# Patient Record
Sex: Female | Born: 1980 | Race: White | Hispanic: No | Marital: Single | Smoking: Never smoker
Health system: Southern US, Community
[De-identification: ages and names within clinical notes are randomized; demographics above are authoritative.]

## PROBLEM LIST (undated history)

## (undated) DIAGNOSIS — T7840XA Allergy, unspecified, initial encounter: Secondary | ICD-10-CM

## (undated) DIAGNOSIS — F419 Anxiety disorder, unspecified: Secondary | ICD-10-CM

## (undated) DIAGNOSIS — E739 Lactose intolerance, unspecified: Secondary | ICD-10-CM

## (undated) HISTORY — DX: Anxiety disorder, unspecified: F41.9

## (undated) HISTORY — DX: Lactose intolerance, unspecified: E73.9

## (undated) HISTORY — PX: LASIK: SHX215

## (undated) HISTORY — DX: Allergy, unspecified, initial encounter: T78.40XA

## (undated) HISTORY — PX: SHOULDER SURGERY: SHX246

---

## 2006-11-20 ENCOUNTER — Ambulatory Visit: Payer: Self-pay | Admitting: Internal Medicine

## 2006-11-20 LAB — CONVERTED CEMR LAB
ALT: 18 units/L (ref 0–35)
Albumin: 3.9 g/dL (ref 3.5–5.2)
BUN: 11 mg/dL (ref 6–23)
Cholesterol: 166 mg/dL (ref 0–200)
Creatinine, Ser: 0.8 mg/dL (ref 0.4–1.2)
Eosinophils Absolute: 0.2 10*3/uL (ref 0.0–0.6)
GFR calc Af Amer: 111 mL/min
GFR calc non Af Amer: 91 mL/min
HCT: 38.5 % (ref 36.0–46.0)
HDL: 60.3 mg/dL (ref >39.0)
Monocytes Relative: 7.2 % (ref 3.0–11.0)
Neutro Abs: 2.9 10*3/uL (ref 1.4–7.7)
Neutrophils Relative %: 60.3 % (ref 43.0–77.0)
RBC: 4.22 M/uL (ref 3.87–5.11)
RDW: 11.3 % — ABNORMAL LOW (ref 11.5–14.6)
Saturation Ratios: 34.4 % (ref 20.0–50.0)
Total Bilirubin: 0.7 mg/dL (ref 0.3–1.2)
Total Protein: 7.4 g/dL (ref 6.0–8.3)
Triglycerides: 125 mg/dL (ref 0–149)
VLDL: 25 mg/dL (ref 0–40)
WBC: 4.9 10*3/uL (ref 4.5–10.5)

## 2007-02-05 ENCOUNTER — Encounter: Admission: RE | Admit: 2007-02-05 | Discharge: 2007-02-05 | Payer: Self-pay | Admitting: Obstetrics and Gynecology

## 2008-01-09 ENCOUNTER — Ambulatory Visit: Payer: Self-pay | Admitting: Internal Medicine

## 2008-01-09 DIAGNOSIS — M545 Low back pain: Secondary | ICD-10-CM

## 2008-01-09 DIAGNOSIS — IMO0002 Reserved for concepts with insufficient information to code with codable children: Secondary | ICD-10-CM

## 2008-01-13 ENCOUNTER — Encounter: Admission: RE | Admit: 2008-01-13 | Discharge: 2008-01-13 | Payer: Self-pay | Admitting: Internal Medicine

## 2008-01-14 ENCOUNTER — Encounter: Payer: Self-pay | Admitting: Internal Medicine

## 2008-01-17 ENCOUNTER — Encounter: Admission: RE | Admit: 2008-01-17 | Discharge: 2008-03-20 | Payer: Self-pay | Admitting: Internal Medicine

## 2008-01-23 ENCOUNTER — Ambulatory Visit: Payer: Self-pay | Admitting: Internal Medicine

## 2008-01-29 ENCOUNTER — Encounter: Payer: Self-pay | Admitting: Internal Medicine

## 2008-01-30 ENCOUNTER — Encounter: Payer: Self-pay | Admitting: Internal Medicine

## 2008-02-06 ENCOUNTER — Encounter: Payer: Self-pay | Admitting: Internal Medicine

## 2008-03-17 ENCOUNTER — Encounter: Payer: Self-pay | Admitting: Internal Medicine

## 2008-04-17 ENCOUNTER — Encounter: Payer: Self-pay | Admitting: Internal Medicine

## 2010-01-11 ENCOUNTER — Ambulatory Visit: Payer: Self-pay | Admitting: Emergency Medicine

## 2010-01-11 DIAGNOSIS — R300 Dysuria: Secondary | ICD-10-CM | POA: Insufficient documentation

## 2010-01-11 LAB — CONVERTED CEMR LAB
Ketones, urine, test strip: NEGATIVE
Specific Gravity, Urine: 1.01
Urobilinogen, UA: 1

## 2010-01-13 ENCOUNTER — Encounter: Payer: Self-pay | Admitting: Emergency Medicine

## 2010-05-12 NOTE — Letter (Signed)
Summary: Out of School  [facility]  [address]   [place], [place] [address]   Phone: [phone]  Fax: [phone]    January 11, 2010   Student:  Ardine Eng    To Whom It May Concern:   For Medical reasons, please excuse the above named student from class and today's test for the following dates:  Start:   January 11, 2010  If you need additional information, please feel free to contact our office.   Sincerely,    Hoyt Koch MD    ****This is a legal document and cannot be tampered with.  Schools are authorized to verify all information and to do so accordingly.

## 2010-05-12 NOTE — Assessment & Plan Note (Signed)
Summary: POSS UTI/TJ   Vital Signs:  Patient Profile:   30 Years Old Female CC:      dysuria x today Height:     67 inches Weight:      127 pounds O2 Sat:      100 % O2 treatment:    Room Air Temp:     97.7 degrees F oral Pulse rate:   77 / minute Resp:     16 per minute BP sitting:   122 / 87  (left arm) Cuff size:   regular  Vitals Entered By: Lajean Saver RN (January 11, 2010 6:42 PM)                  Updated Prior Medication List: YAZ 3-0.02 MG TABS (DROSPIRENONE-ETHINYL ESTRADIOL)  FLONASE 50 MCG/ACT SUSP (FLUTICASONE PROPIONATE)  AZO-STANDARD 95 MG TABS (PHENAZOPYRIDINE HCL)   Current Allergies (reviewed today): ! NAPROSYNHistory of Present Illness Chief Complaint: dysuria x today History of Present Illness: Patient complains of UTI symptoms for 1-2 days.  She describes the pain as burning during urination.  She has not used any OTC meds. + dysuria + frequency No urgency + hematuria No vaginal discharge No fever/chills No lower abdomenal pain No back pain No fatigue   REVIEW OF SYSTEMS Constitutional Symptoms      Denies fever, chills, night sweats, weight loss, weight gain, and fatigue.  Eyes       Denies change in vision, eye pain, eye discharge, glasses, contact lenses, and eye surgery. Ear/Nose/Throat/Mouth       Denies hearing loss/aids, change in hearing, ear pain, ear discharge, dizziness, frequent runny nose, frequent nose bleeds, sinus problems, sore throat, hoarseness, and tooth pain or bleeding.  Respiratory       Denies dry cough, productive cough, wheezing, shortness of breath, asthma, bronchitis, and emphysema/COPD.  Cardiovascular       Denies murmurs, chest pain, and tires easily with exhertion.    Gastrointestinal       Denies stomach pain, nausea/vomiting, diarrhea, constipation, blood in bowel movements, and indigestion. Genitourniary       Complains of painful urination.      Denies kidney stones and loss of urinary  control. Neurological       Denies paralysis, seizures, and fainting/blackouts. Musculoskeletal       Denies muscle pain, joint pain, joint stiffness, decreased range of motion, redness, swelling, muscle weakness, and gout.  Skin       Denies bruising, unusual mles/lumps or sores, and hair/skin or nail changes.  Psych       Denies mood changes, temper/anger issues, anxiety/stress, speech problems, depression, and sleep problems.  Past History:  Past Medical History: Reviewed history from 01/09/2008 and no changes required. Unremarkable  Past Surgical History: Reviewed history from 01/09/2008 and no changes required. Denies surgical history  Family History: Reviewed history from 01/09/2008 and no changes required. Family History Diabetes 1st degree relative Family History Hypertension Family History Thyroid disease  Social History: Occupation:accountant Single Never Smoked Alcohol use-rare Drug use-no Regular exercise-yes Physical Exam General appearance: well developed, well nourished, no acute distress Heart: regular rate and  rhythm, no murmur Back: no CVAT Skin: no obvious rashes or lesions MSE: oriented to time, place, and person Assessment New Problems: URINARY TRACT INFECTION (ICD-599.0) DYSURIA (ICD-788.1)   Patient Education: Patient and/or caregiver instructed in the following: rest, fluids, Tylenol prn.  Plan New Medications/Changes: PHENAZOPYRIDINE HCL 200 MG TABS (PHENAZOPYRIDINE HCL) 1 tab by mouth three times a  day for 2 days  #6 x 0, 01/11/2010, Hoyt Koch MD CIPROFLOXACIN HCL 250 MG TABS (CIPROFLOXACIN HCL) 1 tab by mouth two times a day for 7 days  #14 x 0, 01/11/2010, Hoyt Koch MD  New Orders: T-Culture, Urine [[id number]] New Patient Level III [14782] UA Dipstick w/o Micro (automated)  [81003] Planning Comments:   Wipe front to back Hydration Take meds as directed Follow-up with your primary care physician if not  improving or if getting worse   The patient and/or caregiver has been counseled thoroughly with regard to medications prescribed including dosage, schedule, interactions, rationale for use, and possible side effects and they verbalize understanding.  Diagnoses and expected course of recovery discussed and will return if not improved as expected or if the condition worsens. Patient and/or caregiver verbalized understanding.  Prescriptions: PHENAZOPYRIDINE HCL 200 MG TABS (PHENAZOPYRIDINE HCL) 1 tab by mouth three times a day for 2 days  #6 x 0   Entered and Authorized by:   Hoyt Koch MD   Signed by:   Hoyt Koch MD on 01/11/2010   Method used:   Print then Give to Patient   RxID:   [id number] CIPROFLOXACIN HCL 250 MG TABS (CIPROFLOXACIN HCL) 1 tab by mouth two times a day for 7 days  #14 x 0   Entered and Authorized by:   Hoyt Koch MD   Signed by:   Hoyt Koch MD on 01/11/2010   Method used:   Print then Give to Patient   RxID:   [id number]   Orders Added: 1)  T-Culture, Urine [[id number]] 2)  New Patient Level III [74259] 3)  UA Dipstick w/o Micro (automated)  [81003]  Laboratory Results   Urine Tests  Date/Time Received: January 11, 2010 7:03 PM  Date/Time Reported: January 11, 2010 7:03 PM   Routine Urinalysis   Color: orange Appearance: Clear Glucose: trace   (Normal Range: Negative) Bilirubin: negative   (Normal Range: Negative) Ketone: negative   (Normal Range: Negative) Spec. Gravity: 1.010   (Normal Range: 1.003-1.035) Blood: 3+   (Normal Range: Negative) pH: 5.0   (Normal Range: 5.0-8.0) Protein: 1+   (Normal Range: Negative) Urobilinogen: 1.0   (Normal Range: 0-1) Nitrite: positive   (Normal Range: Negative) Leukocyte Esterace: 3+   (Normal Range: Negative)    Comments: Azo taken today

## 2010-08-24 NOTE — Assessment & Plan Note (Signed)
[name]                           PRIMARY CARE OFFICE NOTE   Colleen Hendrix, Colleen Hendrix                         MRN:          [id number]  DATE:11/20/2006                            DOB:          1980-04-20    CHIEF COMPLAINT:  New patient to practice.   HISTORY OF PRESENT ILLNESS:  Patient is a 30 year old white female here  to establish primary care.  She is originally from the [place]  area but last lived in [place], [place] x3 years.  She reports history of  gastroesophageal reflux disease.  In Spring of 2006, she experienced  severe abdominal pain.  She was evaluated by a gastroenterologist but  due to financial reasons did not have an EGD.  She was prescribed  Aciphex x1 month which completely relieved her symptoms.  She attributes  her ulcers to stress on the job.  She denied any history of dysphagia.   She has had bilaternal laser eye surgery.  She had redo in the right eye  due to astigmatism.   CURRENT MEDICATIONS:  Yasmin birth control 1 a day.   ALLERGIES TO MEDICATIONS:  NONE KNOWN.   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   FAMILY HISTORY:  Mother and father are both in their early 57s.  Mother  has high blood pressure and high cholesterol.  Maternal grandfather does  have CABG x3.  Maternal grandfather has leukemia.  There are several  extended family members that have hemochromatosis.  Mother is known to  be carrier.  Patient has older sister that is hypothyroid.   HABITS:  Occasional social alcohol, no history of tobacco use.   PREVENTATIVE CARE HISTORY:  Her last Pap was in September 11 of 2007.  She routinely follows up with a gynecologist.  Her last tetanus shot was  in the year 2000.   REVIEW OF SYSTEMS:  Occasional sneezing, nasal congestion.  Patient  denies any chest pain but has intermittent palpitations.  No shortness  of breath.  No  history of asthma.  No current GI symptoms.  No  constipation, diarrhea.  No dark stools or blood in her stool and all  other systems negative.  She exercises on a regular basis and follows a  very healthy diet.   PHYSICAL EXAMINATION:  VITALS:  Height is 5 foot 7, weight is 120  pounds, temperature is 98.1, pulse is 85, blood pressure is 104/73.  GENERAL:  This patient is a well-developed, well-nourished 30 year old  thin white female, no apparent distress.  HEENT:  Normocephalic/atraumatic, pupils are equal and reactive to light  bilaterally, extraocular motility was intact, patient was anicteric,  conjunctivae was within normal limits, external auditory canals and  tympanic membranes were clear bilaterally, hearing was grossly normal,  oropharyngeal exam was unremarkable.  NECK:  Supple, I could not appreciate any thyromegaly or thyroid nodules  and there was no carotid bruit.  CHEST: Normal respiratory effort, lungs are clear to auscultation  bilaterally.  No rhonchi, rales or wheezing.  CARDIOVASCULAR:  Regular rate and rhythm, no significant murmurs, rub or  gallops appreciated.  ABDOMEN:  Soft, nontender, positive bowel sounds, no organomegaly.  MUSCULOSKELETAL:  No clubbing, cyanosis or edema.  SKIN:  Warm and dry.  NEUROLOGIC:  Cranial nerves II-XII grossly intact; non-focal.   IMPRESSION:  Routine physical is otherwise healthy 30 year old white  female with past medical history of presumed gastric ulcers related to  stress and family history of hemachromatosis.   RECOMMENDATIONS:  We will obtain a baseline EKG today and screening labs  checking her thyroid, CBC.  I added iron studies to screen for  hemachromatosis although my suspicion is low.   Followup will be every 1-2 years or as needed.     Barbette Hair. Artist Pais, DO  Electronically Signed    RDY/MedQ  DD: 11/20/2006  DT: 11/20/2006  Job #: [id number]

## 2011-06-30 ENCOUNTER — Encounter (INDEPENDENT_AMBULATORY_CARE_PROVIDER_SITE_OTHER): Payer: BC Managed Care – PPO | Admitting: Obstetrics and Gynecology

## 2011-06-30 DIAGNOSIS — N760 Acute vaginitis: Secondary | ICD-10-CM

## 2011-06-30 DIAGNOSIS — N949 Unspecified condition associated with female genital organs and menstrual cycle: Secondary | ICD-10-CM

## 2011-06-30 DIAGNOSIS — N898 Other specified noninflammatory disorders of vagina: Secondary | ICD-10-CM

## 2011-08-08 ENCOUNTER — Telehealth: Payer: Self-pay | Admitting: Obstetrics and Gynecology

## 2011-08-08 NOTE — Telephone Encounter (Signed)
Routed to triage 

## 2011-08-09 ENCOUNTER — Other Ambulatory Visit: Payer: Self-pay | Admitting: Obstetrics and Gynecology

## 2011-08-09 MED ORDER — NORETHIN ACE-ETH ESTRAD-FE 1-20 MG-MCG(24) PO TABS: ORAL_TABLET | ORAL | Status: DC

## 2011-08-09 NOTE — Telephone Encounter (Signed)
Tc to pt per rx request. Pt needs rf's for Loestrin 24 due to original rx form 05/2011 no having any rf's added. Rx called to pharmacy with rf's thru 05/2012. Spoke with Erica(pharmacist). Pt agrees.

## 2011-10-09 ENCOUNTER — Ambulatory Visit (INDEPENDENT_AMBULATORY_CARE_PROVIDER_SITE_OTHER): Payer: BC Managed Care – PPO | Admitting: Internal Medicine

## 2011-10-09 VITALS — BP 121/83 | HR 64 | Temp 98.7°F | Resp 16 | Ht 67.0 in | Wt 130.0 lb

## 2011-10-09 DIAGNOSIS — Z23 Encounter for immunization: Secondary | ICD-10-CM

## 2011-10-09 DIAGNOSIS — S0990XA Unspecified injury of head, initial encounter: Secondary | ICD-10-CM

## 2011-10-09 NOTE — Progress Notes (Signed)
  Subjective:    Patient ID: Winda Summerall, female    DOB: 06/26/1980, 31 y.o.   MRN: [id number]  HPI 31 yr old [place] female presents with head injury due to hitting her head  on the corner of a bannister on the top of the stairs while bending forward picking up laundry. No loss of conscious; Headache; No weakness; No trouble walking;Some nausea;neck pain. Pt states she was dizzy while driving to [facility].    Past history of mild concussion 3 years ago with no sequelae  Review of SystemsNoncontributory     Objective:   Physical Exam Vital signs stable In no acute distress Contusion with swelling and abrasion over the left for head-small Pupils equal round reactive to light and accommodation/EOMs conjugate Cranial nerves II through XII are intact DTRs symmetrical in upper and lower extremities No sensory or motor losses of her lower or upper extrem is tender over the left para cervical and trapezius area on the left with pain on neck extension but full range of motion otherwise     Assessment & Plan:  Head injury without loss of consciousness Abrasion face Cervical strain on the left  Reassured Tylenol for headache if needed Follow up If problems increase Call if needs a muscle relaxer at bedtime, otherwise apply heat to neck

## 2012-01-19 ENCOUNTER — Other Ambulatory Visit: Payer: Self-pay | Admitting: Family Medicine

## 2012-01-19 MED ORDER — FOLIC ACID 1 MG PO TABS: 1.0000 mg | ORAL_TABLET | Freq: Every day | ORAL | Status: AC

## 2012-04-03 ENCOUNTER — Ambulatory Visit (INDEPENDENT_AMBULATORY_CARE_PROVIDER_SITE_OTHER): Payer: BC Managed Care – PPO | Admitting: Physician Assistant

## 2012-04-03 VITALS — BP 112/77 | HR 86 | Temp 98.5°F | Resp 16 | Ht 67.0 in | Wt 127.6 lb

## 2012-04-03 DIAGNOSIS — J069 Acute upper respiratory infection, unspecified: Secondary | ICD-10-CM

## 2012-04-03 DIAGNOSIS — J029 Acute pharyngitis, unspecified: Secondary | ICD-10-CM

## 2012-04-03 MED ORDER — TRAMADOL HCL 50 MG PO TABS: 50.0000 mg | ORAL_TABLET | Freq: Three times a day (TID) | ORAL | Status: DC | PRN

## 2012-04-03 MED ORDER — IPRATROPIUM BROMIDE 0.06 % NA SOLN: 2.0000 | Freq: Three times a day (TID) | NASAL | Status: DC

## 2012-04-03 NOTE — Progress Notes (Signed)
Patient ID: Colleen Hendrix MRN: [id number], DOB: [birth year], 31 y.o. Date of Encounter: 04/03/2012, 2:32 PM  Primary Physician: Sanda Linger, MD  Chief Complaint:  Chief Complaint  Patient presents with  . Sore Throat    x 4 days  . Headache  . Nasal Congestion    HPI: 32 y.o. year old female presents with a three day history of nasal congestion, rhinorrhea, sinus pressure, sinus headache, and a one day history of sore throat. Afebrile and no chills. No cough or otalgia. Normal hearing. No GI complaints. Able to swallow saliva, but hurts to do so. Decreased appetite secondary to sore throat. Has been taking Mucinex and this has bene helping considerably. She normally would not be her to be evaluated for this; however, her grandmother is ill at her parent's house and she does not want to get her sick. This does not feel like her typical strep throat infections.   No past medical history on file.   Home Meds: Prior to Admission medications   Medication Sig Start Date End Date Taking? Authorizing Provider  folic acid (FOLVITE) 1 MG tablet Take 1 tablet (1 mg total) by mouth daily. 01/19/12  Yes Heather M Marte, PA-C  Norethindrone Acetate-Ethinyl Estrad-FE (LOESTRIN 24 FE) 1-20 MG-MCG(24) tablet Pt to take 1 tab po for 12 wks of active pills then 3days off 08/09/11  Yes Hal Morales, MD    Allergies: No Known Allergies  History   Social History  . Marital Status: Single    Spouse Name: N/A    Number of Children: N/A  . Years of Education: N/A   Occupational History  . Not on file.   Social History Main Topics  . Smoking status: Never Smoker   . Smokeless tobacco: Not on file  . Alcohol Use: 0.0 oz/week     Comment: once a week  . Drug Use: Not on file  . Sexually Active: Yes    Birth Control/ Protection: Pill   Other Topics Concern  . Not on file   Social History Narrative  . No narrative on file     Review of Systems: Constitutional: Positive for fatigue.  Negative for chills, fever, night sweats or weight changes HEENT: see above Cardiovascular: negative for chest pain or palpitations Respiratory: negative for cough, hemoptysis, wheezing, or shortness of breath Abdominal: negative for abdominal pain, nausea, vomiting or diarrhea Dermatological: negative for rash Neurologic: Positive for headache.    Physical Exam: Blood pressure 112/77, pulse 86, temperature 98.5 F (36.9 C), temperature source Oral, resp. rate 16, height 5\' 7"  (1.702 m), weight 127 lb 9.6 oz (57.879 kg), SpO2 100.00%., Body mass index is 19.98 kg/(m^2). General: Well developed, well nourished, in no acute distress. Head: Normocephalic, atraumatic, eyes without discharge, sclera non-icteric, nares are congested. Bilateral auditory canals clear, TM's are without perforation, pearly grey with reflective cone of light bilaterally. Mild right sided maxillary and frontal sinus TTP. Oral cavity moist, dentition normal. Posterior pharynx erythematous with post nasal drip. No peritonsillar abscess or tonsillar exudate. Uvula midline. Neck: Supple. No thyromegaly. Full ROM. No lymphadenopathy. Mild AC lymphadenopathy < 2 cm. Lungs: Clear bilaterally to auscultation without wheezes, rales, or rhonchi. Breathing is unlabored. Heart: RRR with S1 S2. No murmurs, rubs, or gallops appreciated. Msk:  Strength and tone normal for age. Extremities: No clubbing or cyanosis. No edema. Neuro: Alert and oriented X 3. Moves all extremities spontaneously. CNII-XII grossly in tact. Psych:  Responds to questions appropriately with a normal  affect.   Labs: Results for orders placed in visit on 04/03/12  POCT RAPID STREP A (OFFICE)      Component Value Range   Rapid Strep A Screen Negative  Negative   Throat culture pending.  ASSESSMENT AND PLAN:  31 y.o. year old female with viral URI and pharyngitis.  -Supportive care -Continue Mucinex -Add in Atrovent NS 0.06% 2 sprays each nare bid prn #1  RF 3 -Ultram 50 mg 1 po tid prn pain #30 no RF, SED -Push water -Rest -Await culture results -Avoid direct face to face contact -Good hand washing practices -RTC precautions -RTC 3-5 days if no improvement  Signed, Eula Listen, PA-C 04/03/2012 2:32 PM

## 2012-04-05 NOTE — Progress Notes (Signed)
Quick Note:  Await final results.  Eula Listen, PA-C 04/05/2012 1:00 PM ______

## 2012-04-06 ENCOUNTER — Ambulatory Visit (INDEPENDENT_AMBULATORY_CARE_PROVIDER_SITE_OTHER): Payer: BC Managed Care – PPO | Admitting: Physician Assistant

## 2012-04-06 VITALS — BP 100/70 | HR 77 | Temp 98.2°F | Resp 16 | Ht 67.0 in | Wt 132.0 lb

## 2012-04-06 DIAGNOSIS — R197 Diarrhea, unspecified: Secondary | ICD-10-CM

## 2012-04-06 DIAGNOSIS — J069 Acute upper respiratory infection, unspecified: Secondary | ICD-10-CM

## 2012-04-06 DIAGNOSIS — R110 Nausea: Secondary | ICD-10-CM

## 2012-04-06 DIAGNOSIS — R1110 Vomiting, unspecified: Secondary | ICD-10-CM

## 2012-04-06 LAB — POCT CBC
Granulocyte percent: 70.9 %G (ref 37–80)
Hemoglobin: 12.9 g/dL (ref 12.2–16.2)
MCH, POC: 30.5 pg (ref 27–31.2)
MCHC: 31.9 g/dL (ref 31.8–35.4)
MCV: 95.7 fL (ref 80–97)
MID (cbc): 0.4 (ref 0–0.9)
POC Granulocyte: 5.3 (ref 2–6.9)
POC LYMPH PERCENT: 23.5 %L (ref 10–50)
RBC: 4.23 M/uL (ref 4.04–5.48)
RDW, POC: 12.5 %

## 2012-04-06 LAB — POCT URINALYSIS DIPSTICK
Blood, UA: NEGATIVE
Ketones, UA: NEGATIVE
Leukocytes, UA: NEGATIVE
Nitrite, UA: NEGATIVE
Protein, UA: NEGATIVE
Spec Grav, UA: 1.01
pH, UA: 7

## 2012-04-06 LAB — CULTURE, GROUP A STREP

## 2012-04-06 LAB — COMPREHENSIVE METABOLIC PANEL
ALT: 13 U/L (ref 0–35)
AST: 15 U/L (ref 0–37)
Chloride: 104 mEq/L (ref 96–112)
Potassium: 4.7 mEq/L (ref 3.5–5.3)
Total Bilirubin: 0.4 mg/dL (ref 0.3–1.2)
Total Protein: 7.1 g/dL (ref 6.0–8.3)

## 2012-04-06 LAB — POCT URINE PREGNANCY: Preg Test, Ur: NEGATIVE

## 2012-04-06 LAB — POCT UA - MICROSCOPIC ONLY: Yeast, UA: NEGATIVE

## 2012-04-06 MED ORDER — ONDANSETRON 4 MG PO TBDP: 8.0000 mg | ORAL_TABLET | Freq: Once | ORAL | Status: DC

## 2012-04-06 MED ORDER — ONDANSETRON 4 MG PO TBDP: 4.0000 mg | ORAL_TABLET | Freq: Three times a day (TID) | ORAL | Status: DC | PRN

## 2012-04-06 NOTE — Progress Notes (Signed)
Patient ID: Colleen Hendrix MRN: [id number], DOB: Aug 25, 1980, 31 y.o. Date of Encounter: 04/06/2012, 1:40 PM  Primary Physician: Sanda Linger, MD  Chief Complaint: Continued nasal congestion and rhinorrhea. Now with nausea and diarrhea.   HPI: 31 y.o. year old female here originally seen on 04/03/12 with three day history of nasal congestion, rhinorrhea, sinus pressure, headache, and one day history of ST. At that time she was diagnosed with viral URI and pharyngitis. Her RST and throat culture were negative. She was treated with Atrovent nasal spray and given Ultram for her sore throat. She does not feel like the Atrovent nasal spray has helped her.   Since her office visit on 04/03/12 she states her sore throat has resolved. Her nasal congestion, rhinorrhea, and cough persist. Her cough is not productive. She denies any chest congestion and feels like her cough is more so related to her post nasal drip. She is not short of breath and does not have any wheezing. On 04/04/12 in the evening she did develop some nausea without emesis and diarrhea. Today she has had three loose stools. No blood mixed in. She complains of mild abdominal pain/cramping. She denies any fevers, but has had some chills. She has been taking Tylenol consistently and wonders if this could be masking any fever.   Interestingly, she does bring up the fact that for the past one year she will have an episode once per month shortly after eating of both nausea, vomiting, and diarrhea all at the same time. She states she is usually sitting on the toilet with a trash can in her hands. She has always had problems with lactose and for the most part has cut this out of her diet, but sometimes she does consume products with this. She has always chalked this up to "food poisoning or being stressed." She does state that she has a very stressful job. 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 Her boyfriend will sometimes eat the exact same meal at a restaurant and be asymptomatic. These episodes are not associated with any discrete right upper quadrant pain, just generalized abdominal discomfort. After the above emesis and diarrhea she does feel better outside of being a little tired/fatigued. She is wondering if this could also be related to her gallbladder. She denies any dysuria, urinary frequency, urinary urgency, or vaginal symptoms.   She has had two evaluations by two separate gynecologists for likely endometriosis. She does not want to have an exploratory laparoscopy at this time. Currently she is treating this with continuous birth control and has noticed a considerable improvement in her symptoms.         No past medical history on file.   Home Meds: Prior to Admission medications   Medication Sig Start Date End Date Taking? Authorizing Provider  folic acid (FOLVITE) 1 MG tablet Take 1 tablet (1 mg total) by mouth daily. 01/19/12  Yes Heather M Marte, PA-C  ipratropium (ATROVENT) 0.06 % nasal spray Place 2 sprays into the nose 3 (three) times daily. 04/03/12  Yes Tekeisha Hakim M Nicholl Onstott, PA-C  Norethindrone Acetate-Ethinyl Estrad-FE (LOESTRIN 24 FE) 1-20 MG-MCG(24) tablet Pt to take 1 tab po for 12 wks of active pills then 3days off 08/09/11  Yes Hal Morales, MD  traMADol (ULTRAM) 50 MG tablet Take 1 tablet (50 mg total) by mouth 3 (three) times daily as needed for pain. 04/03/12  Yes Cataleyah Colborn  M Samel Bruna, PA-C    Allergies: No Known Allergies  History   Social History  . Marital Status: Single    Spouse Name: N/A    Number of Children: N/A  . Years of Education: N/A   Occupational History  . Not on file.   Social History Main Topics  . Smoking status: Never Smoker   . Smokeless tobacco: Not on file  . Alcohol Use: 0.0 oz/week     Comment: once a week  . Drug Use: Not on file  . Sexually Active: Yes    Birth Control/ Protection: Pill   Other  Topics Concern  . Not on file   Social History Narrative  . No narrative on file     Review of Systems: Constitutional: Positive for chills and fatigue. Negative for fever, night sweats, or weight changes  HEENT: see above Cardiovascular: negative for chest pain or palpitations Respiratory: Positive for cough. Negative for hemoptysis, wheezing, or shortness of breath Abdominal: Positive for abdominal pain, nausea, and diarrhea. Negative for vomiting Dermatological: negative for rash Neurologic: negative for dizziness, or syncope   Physical Exam: Blood pressure 100/70, pulse 77, temperature 98.2 F (36.8 C), temperature source Oral, resp. rate 16, height 5\' 7"  (1.702 m), weight 132 lb (59.875 kg), SpO2 97.00%., Body mass index is 20.67 kg/(m^2). General: Well developed, well nourished, in no acute distress. Head: Normocephalic, atraumatic, eyes without discharge, sclera non-icteric, nares are congested. Bilateral auditory canals clear, TM's are without perforation, pearly grey and translucent with reflective cone of light bilaterally. Oral cavity moist, posterior pharynx with post nasal drip. No exudate, erythema, or peritonsillar abscess. Uvula midline. Neck: Supple. No thyromegaly. Full ROM. Isolated lymph node right AC less than 2 cm. Lungs: Clear bilaterally to auscultation without wheezes, rales, or rhonchi. Breathing is unlabored. Heart: RRR with S1 S2. No murmurs, rubs, or gallops appreciated. Abdomen: Soft, non-distended with normoactive bowel sounds. Mild diffuse tenderness to palpation. No hepatosplenomegaly. Negative Murphy's. No rebound/guarding. No obvious abdominal masses. Negative McBurney's and table jar.  Msk:  Strength and tone normal for age. Extremities/Skin: Warm and dry. No clubbing or cyanosis. No edema. No rashes or suspicious lesions. Neuro: Alert and oriented X 3. Moves all extremities spontaneously. Gait is normal. CNII-XII grossly in tact. Psych:  Responds to  questions appropriately with a normal affect.   Labs: Results for orders placed in visit on 04/06/12  POCT CBC      Component Value Range   WBC 7.5  4.6 - 10.2 K/uL   Lymph, poc 1.8  0.6 - 3.4   POC LYMPH PERCENT 23.5  10 - 50 %L   MID (cbc) 0.4  0 - 0.9   POC MID % 5.6  0 - 12 %M   POC Granulocyte 5.3  2 - 6.9   Granulocyte percent 70.9  37 - 80 %G   RBC 4.23  4.04 - 5.48 M/uL   Hemoglobin 12.9  12.2 - 16.2 g/dL   HCT, POC 29.5  62.1 - 47.9 %   MCV 95.7  80 - 97 fL   MCH, POC 30.5  27 - 31.2 pg   MCHC 31.9  31.8 - 35.4 g/dL   RDW, POC 30.8     Platelet Count, POC 205  142 - 424 K/uL   MPV 10.2  0 - 99.8 fL  POCT INFLUENZA A/B      Component Value Range   Influenza A, POC Negative     Influenza B, POC Negative  POCT URINE PREGNANCY      Component Value Range   Preg Test, Ur Negative    POCT UA - MICROSCOPIC ONLY      Component Value Range   WBC, Ur, HPF, POC 1-2     RBC, urine, microscopic 0-1     Bacteria, U Microscopic trace     Mucus, UA neg     Epithelial cells, urine per micros 0-2     Crystals, Ur, HPF, POC neg     Casts, Ur, LPF, POC neg     Yeast, UA neg    POCT URINALYSIS DIPSTICK      Component Value Range   Color, UA pale yellow     Clarity, UA clear     Glucose, UA neg     Bilirubin, UA neg     Ketones, UA neg     Spec Grav, UA 1.010     Blood, UA neg     pH, UA 7.0     Protein, UA neg     Urobilinogen, UA 0.2     Nitrite, UA neg     Leukocytes, UA Negative      CMP pending  ASSESSMENT AND PLAN:  31 y.o. year old female with viral URI, nausea, diarrhea, and history of longstanding nausea, vomiting, and diarrhea.  1. Viral URI -Continue Atrovent nasal -Continue Mucinex -Rest -Fluids -Traveling to the beach in 48 hours, breathing in the air there should help her  2. Nausea/diarrhea -Zofran 4 mg ODT x 2 given in office -Possibly adverse effect from her Ultram -She stopped her Ultram the previous day, it has yet to be 24 hours since she  stopped this -Supportive care at this time -Zofran 4 mg ODT 1 po q 8 hours prn nausea #12 RF 2 -Push fluids -Rest -Return to clinic precautions  3. History of longstanding nausea, vomiting, and diarrhea -Await CMP -Schedule abdominal ultrasound -She will be out of town the entire up coming week, will plan for this to be completed when she is back  -If normal ultrasound, will plan to treat from stress standpoint first. If she fails that trial will refer to GI. -Has Zofran for prn use -Food diary to check for any associations  -Return to clinic precautions   Signed, Eula Listen, PA-C 04/06/2012 1:40 PM

## 2012-04-07 ENCOUNTER — Other Ambulatory Visit: Payer: Self-pay | Admitting: Physician Assistant

## 2012-04-07 DIAGNOSIS — R7989 Other specified abnormal findings of blood chemistry: Secondary | ICD-10-CM

## 2012-04-07 DIAGNOSIS — R197 Diarrhea, unspecified: Secondary | ICD-10-CM

## 2012-04-07 NOTE — Addendum Note (Signed)
Addended by: Sondra Barges on: 04/07/2012 09:54 AM   Modules accepted: Orders

## 2012-04-17 ENCOUNTER — Encounter: Payer: Self-pay | Admitting: *Deleted

## 2012-05-25 ENCOUNTER — Other Ambulatory Visit: Payer: Self-pay

## 2013-02-03 ENCOUNTER — Encounter: Payer: Self-pay | Admitting: Internal Medicine

## 2013-02-03 ENCOUNTER — Ambulatory Visit (INDEPENDENT_AMBULATORY_CARE_PROVIDER_SITE_OTHER): Payer: BC Managed Care – PPO | Admitting: Internal Medicine

## 2013-02-03 VITALS — BP 124/80 | HR 74 | Temp 97.9°F | Resp 16 | Ht 67.5 in | Wt 133.0 lb

## 2013-02-03 DIAGNOSIS — H8309 Labyrinthitis, unspecified ear: Secondary | ICD-10-CM

## 2013-02-03 DIAGNOSIS — S61409A Unspecified open wound of unspecified hand, initial encounter: Secondary | ICD-10-CM

## 2013-02-03 DIAGNOSIS — S61402A Unspecified open wound of left hand, initial encounter: Secondary | ICD-10-CM

## 2013-02-03 DIAGNOSIS — R51 Headache: Secondary | ICD-10-CM

## 2013-02-03 MED ORDER — MECLIZINE HCL 25 MG PO TABS: 25.0000 mg | ORAL_TABLET | Freq: Three times a day (TID) | ORAL | Status: DC | PRN

## 2013-02-03 NOTE — Progress Notes (Signed)
Subjective:    Patient ID: Colleen Hendrix, female    DOB: [birth year], 32 y.o.   MRN: [id number] HPI  HPI Comments: Colleen Hendrix is a 32 y.o. female who presents to the [facility] complaining of a left hand laceration. Pt also complains of what she thinks is "motion sickness". Pt states she was cutting tomatoes today and mistakenly cut her finger. Bleeding is controlled. She reports she flew to [facility] last Saturday and a migraine began the next day. Pt states she usually gets "car sick". She reports feeling "not fully with it'. Pt states she is feeling a bit nauseas, having some tenderness, has a "bit" of a sore throat, and experiencing some constant pressure in the head. She has taken ibuprofen w/o relief for the h/a. Pt denies any other pain. She denies any insomnia. She denies any head trauma. She reports being able to eat normally but does not have a huge appetite. She reports "taking a walk" to relieve symptoms but there was no relief. Sitting down for a long period of time makes her feel better. She reports usually having allergies this time of year. Pt takes zyrtec and Claritin to relive allergies.         Review of Systems  Constitutional: Negative for fever and chills.  HENT: Positive for sinus pressure and sore throat. Negative for congestion.   Skin: Positive for wound.  Allergic/Immunologic: Positive for environmental allergies.  Neurological: Positive for headaches.        Objective:   Physical Exam  Nursing note and vitals reviewed. Constitutional: She is oriented to person, place, and time. She appears well-developed and well-nourished. No distress.  Awake, alert, nontoxic appearance  HENT:  Head: Normocephalic and atraumatic.  Right Ear: External ear normal.  Left Ear: External ear normal.  Nose: Nose normal.  Mouth/Throat: Oropharynx is clear and moist. No oropharyngeal exudate.  Mild boggy turbs  Eyes: Conjunctivae and EOM are  normal. Pupils are equal, round, and reactive to light. No scleral icterus.  Neck: Normal range of motion. Neck supple. No thyromegaly present.  Cardiovascular: Normal rate, regular rhythm, normal heart sounds and intact distal pulses.   No murmur heard. Pulmonary/Chest: Effort normal and breath sounds normal. No respiratory distress. She has no wheezes.  Abdominal: Soft. Bowel sounds are normal. She exhibits no mass. There is no tenderness. There is no rebound and no guarding.  Musculoskeletal: Normal range of motion. She exhibits no edema.  Lymphadenopathy:    She has no cervical adenopathy.  Neurological: She is alert and oriented to person, place, and time. She has normal reflexes. No cranial nerve deficit. Coordination normal.  Speech is clear and goal oriented Moves extremities symm/gait wnl No nystag with dix-Hallpike even tho sxt precip by looking L and rolling head to L Cerebell intact  Skin: Skin is warm and dry. She is not diaphoretic.  Psychiatric: She has a normal mood and affect. Her behavior is normal. Thought content normal.  puncture wound L hand web space L 2/3 //minor w/out signs tendon or nerve invol     Assessment & Plan:  HA (headache)--R frontal/?related to sinus congestion  Acute labyrinthitis, unspecified laterality  Wound, open, hand with or without fingers, left, initial encounter  Meds ordered this encounter  Medications  . Loratadine (CLARITIN PO)    Sig: Take by mouth.  . Cetirizine HCl (ZYRTEC ALLERGY PO)    Sig: Take by mouth.  . meclizine (ANTIVERT) 25 MG tablet  Sig: Take 1 tablet (25 mg total) by mouth 3 (three) times daily as needed for dizziness or nausea. Especially hs    Dispense:  20 tablet    Refill:  0   Add sudafed Use ibupr in am F/u 1 week if not well

## 2013-08-28 ENCOUNTER — Ambulatory Visit (INDEPENDENT_AMBULATORY_CARE_PROVIDER_SITE_OTHER): Payer: BC Managed Care – PPO | Admitting: Family Medicine

## 2013-08-28 VITALS — BP 112/68 | HR 72 | Temp 98.0°F | Resp 16 | Ht 66.0 in | Wt 134.4 lb

## 2013-08-28 DIAGNOSIS — R110 Nausea: Secondary | ICD-10-CM

## 2013-08-28 DIAGNOSIS — R197 Diarrhea, unspecified: Secondary | ICD-10-CM

## 2013-08-28 LAB — POCT CBC
Granulocyte percent: 54 %G (ref 37–80)
HCT, POC: 40.5 % (ref 37.7–47.9)
Hemoglobin: 13.2 g/dL (ref 12.2–16.2)
Lymph, poc: 1.6 (ref 0.6–3.4)
MCH, POC: 30.6 pg (ref 27–31.2)
MCHC: 32.6 g/dL (ref 31.8–35.4)
MCV: 93.7 fL (ref 80–97)
MID (cbc): 0.3 (ref 0–0.9)
MPV: 11.2 fL (ref 0–99.8)
POC Granulocyte: 2.3 (ref 2–6.9)
POC LYMPH PERCENT: 37.8 %L (ref 10–50)
POC MID %: 8.2 %M (ref 0–12)
Platelet Count, POC: 188 10*3/uL (ref 142–424)
RBC: 4.32 M/uL (ref 4.04–5.48)
RDW, POC: 12.9 %
WBC: 4.2 10*3/uL — AB (ref 4.6–10.2)

## 2013-08-28 LAB — POCT UA - MICROSCOPIC ONLY
Bacteria, U Microscopic: NEGATIVE
Casts, Ur, LPF, POC: NEGATIVE
Crystals, Ur, HPF, POC: NEGATIVE
Mucus, UA: NEGATIVE
RBC, urine, microscopic: NEGATIVE
Yeast, UA: NEGATIVE

## 2013-08-28 LAB — POCT URINALYSIS DIPSTICK
Bilirubin, UA: NEGATIVE
Blood, UA: NEGATIVE
Glucose, UA: NEGATIVE
Ketones, UA: NEGATIVE
Leukocytes, UA: NEGATIVE
Nitrite, UA: NEGATIVE
Protein, UA: NEGATIVE
Spec Grav, UA: 1.01
Urobilinogen, UA: 0.2
pH, UA: 7.5

## 2013-08-28 NOTE — Progress Notes (Signed)
Subjective:    Patient ID: Colleen Hendrix, female    DOB: 1980/06/22, 33 y.o.   MRN: [id number]  Diarrhea  Associated symptoms include abdominal pain. Pertinent negatives include no chills, fever or vomiting.   This chart was scribed for Elvina SidleKurt Lauenstein, MD by Andrew Auaven Small, ED Scribe. This patient was seen in room 4 and the patient's care was started at 12:28 PM.  HPI Comments: Colleen Hendrix is a 33 y.o. female who presents to the [facility] complaining of diarrhea onset one week. Pt denies feeling bad. She reports an hour or two after eating she has watery stool. Pt feels relief after BM. Pt reports last episode was this morning. Pt reports mid-lower abdominal cramping and abdominal bloating. Pt denies blood in stool.  Pt has not taken an OTC medication. Pt reports stress at work. Pt lives with boyfriend but denies sick contacts. Pt is lactose intolerant but has eliminated dairy from diet. Pt denies fever or any other symptoms. Pt denies taking abx. Pt denies any other medical illness.   Patient is very reluctant to take any medication whatsoever. He wants to avoid antibiotics.  No past medical history on file. No Known Allergies Prior to Admission medications   Medication Sig Start Date End Date Taking? Authorizing Provider  Cetirizine HCl (ZYRTEC ALLERGY PO) Take by mouth.   Yes Historical Provider, MD  folic acid (FOLVITE) 1 MG tablet Take 1 tablet (1 mg total) by mouth daily. 01/19/12  Yes Heather M Marte, PA-C  Loratadine (CLARITIN PO) Take by mouth.   Yes Historical Provider, MD  Norethindrone Acetate-Ethinyl Estrad-FE (LOESTRIN 24 FE) 1-20 MG-MCG(24) tablet Pt to take 1 tab po for 12 wks of active pills then 3days off 08/09/11  Yes Hal MoralesVanessa P Haygood, MD  ipratropium (ATROVENT) 0.06 % nasal spray Place 2 sprays into the nose 3 (three) times daily. 04/03/12   Sondra Bargesyan M Dunn, PA-C  meclizine (ANTIVERT) 25 MG tablet Take 1 tablet (25 mg total) by mouth 3 (three) times daily  as needed for dizziness or nausea. Especially hs 02/03/13   Tonye Pearsonobert P Doolittle, MD   Review of Systems  Constitutional: Negative for fever, chills and diaphoresis.  Gastrointestinal: Positive for nausea, abdominal pain, diarrhea and abdominal distention. Negative for vomiting and blood in stool.      Objective:   Physical Exam  Nursing note and vitals reviewed. Constitutional: She is oriented to person, place, and time. She appears well-developed and well-nourished. No distress.  HENT:  Head: Normocephalic and atraumatic.  Eyes: Conjunctivae and EOM are normal. Pupils are equal, round, and reactive to light. No scleral icterus.  Neck: Neck supple. No tracheal deviation present. No thyromegaly present.  Cardiovascular: Normal rate.   Pulmonary/Chest: Effort normal. No respiratory distress.  Abdominal: Soft. She exhibits distension. She exhibits no mass. There is tenderness. There is no rebound and no guarding.  Hyperactive bowel sounds, mild distention, mild lower abdominal tenderness  Musculoskeletal: Normal range of motion.  Neurological: She is alert and oriented to person, place, and time.  Skin: Skin is warm and dry.  Psychiatric: She has a normal mood and affect. Her behavior is normal.   Results for orders placed in visit on 08/28/13  POCT CBC      Result Value Ref Range   WBC 4.2 (*) 4.6 - 10.2 K/uL   Lymph, poc 1.6  0.6 - 3.4   POC LYMPH PERCENT 37.8  10 - 50 %L   MID (cbc) 0.3  0 - 0.9   POC MID % 8.2  0 - 12 %M   POC Granulocyte 2.3  2 - 6.9   Granulocyte percent 54.0  37 - 80 %G   RBC 4.32  4.04 - 5.48 M/uL   Hemoglobin 13.2  12.2 - 16.2 g/dL   HCT, POC 40.940.5  81.137.7 - 47.9 %   MCV 93.7  80 - 97 fL   MCH, POC 30.6  27 - 31.2 pg   MCHC 32.6  31.8 - 35.4 g/dL   RDW, POC 91.412.9     Platelet Count, POC 188  142 - 424 K/uL   MPV 11.2  0 - 99.8 fL  POCT URINALYSIS DIPSTICK      Result Value Ref Range   Color, UA yellow     Clarity, UA clear     Glucose, UA neg      Bilirubin, UA neg     Ketones, UA neg     Spec Grav, UA 1.010     Blood, UA neg     pH, UA 7.5     Protein, UA neg     Urobilinogen, UA 0.2     Nitrite, UA neg     Leukocytes, UA Negative    POCT UA - MICROSCOPIC ONLY      Result Value Ref Range   WBC, Ur, HPF, POC 0-1     RBC, urine, microscopic neg     Bacteria, U Microscopic neg     Mucus, UA neg     Epithelial cells, urine per micros 0-3     Crystals, Ur, HPF, POC neg     Casts, Ur, LPF, POC neg     Yeast, UA neg          Assessment & Plan:     Nausea alone - Plan: POCT CBC, POCT urinalysis dipstick, POCT UA - Microscopic Only, COMPLETE METABOLIC PANEL WITH GFR, T4, free  Diarrhea - Plan: POCT CBC, POCT urinalysis dipstick, POCT UA - Microscopic Only, COMPLETE METABOLIC PANEL WITH GFR, T4, free  Most likely this is a viral gastroenteritis that his not responded to the usual measures. I explained patient that getting on probiotics and taking Imodium may be all she needs. At the same time I think we need to double check other possible explanations at this point since it's been one week of symptoms.  Signed, Elvina SidleKurt Lauenstein, MD

## 2013-08-28 NOTE — Patient Instructions (Signed)
Diarrhea Diarrhea is frequent loose and watery bowel movements. It can cause you to feel weak and dehydrated. Dehydration can cause you to become tired and thirsty, have a dry mouth, and have decreased urination that often is dark yellow. Diarrhea is a sign of another problem, most often an infection that will not last long. In most cases, diarrhea typically lasts 2 3 days. However, it can last longer if it is a sign of something more serious. It is important to treat your diarrhea as directed by your caregive to lessen or prevent future episodes of diarrhea. CAUSES  Some common causes include:  Gastrointestinal infections caused by viruses, bacteria, or parasites.  Food poisoning or food allergies.  Certain medicines, such as antibiotics, chemotherapy, and laxatives.  Artificial sweeteners and fructose.  Digestive disorders. HOME CARE INSTRUCTIONS  Ensure adequate fluid intake (hydration): have 1 cup (8 oz) of fluid for each diarrhea episode. Avoid fluids that contain simple sugars or sports drinks, fruit juices, whole milk products, and sodas. Your urine should be clear or pale yellow if you are drinking enough fluids. Hydrate with an oral rehydration solution that you can purchase at pharmacies, retail stores, and online. You can prepare an oral rehydration solution at home by mixing the following ingredients together:    tsp table salt.   tsp baking soda.   tsp salt substitute containing potassium chloride.  1  tablespoons sugar.  1 L (34 oz) of water.  Certain foods and beverages may increase the speed at which food moves through the gastrointestinal (GI) tract. These foods and beverages should be avoided and include:  Caffeinated and alcoholic beverages.  High-fiber foods, such as raw fruits and vegetables, nuts, seeds, and whole grain breads and cereals.  Foods and beverages sweetened with sugar alcohols, such as xylitol, sorbitol, and mannitol.  Some foods may be well  tolerated and may help thicken stool including:  Starchy foods, such as rice, toast, pasta, low-sugar cereal, oatmeal, grits, baked potatoes, crackers, and bagels.  Bananas.  Applesauce.  Add probiotic-rich foods to help increase healthy bacteria in the GI tract, such as yogurt and fermented milk products.  Wash your hands well after each diarrhea episode.  Only take over-the-counter or prescription medicines as directed by your caregiver.  Take a warm bath to relieve any burning or pain from frequent diarrhea episodes. SEEK IMMEDIATE MEDICAL CARE IF:   You are unable to keep fluids down.  You have persistent vomiting.  You have blood in your stool, or your stools are black and tarry.  You do not urinate in 6 8 hours, or there is only a small amount of very dark urine.  You have abdominal pain that increases or localizes.  You have weakness, dizziness, confusion, or lightheadedness.  You have a severe headache.  Your diarrhea gets worse or does not get better.  You have a fever or persistent symptoms for more than 2 3 days.  You have a fever and your symptoms suddenly get worse. MAKE SURE YOU:   Understand these instructions.  Will watch your condition.  Will get help right away if you are not doing well or get worse. Document Released: 03/18/2002 Document Revised: 03/14/2012 Document Reviewed: 12/04/2011 [employer] Patient Information 2014 [employer].  

## 2013-08-29 LAB — COMPLETE METABOLIC PANEL WITH GFR
ALT: 18 U/L (ref 0–35)
AST: 19 U/L (ref 0–37)
Albumin: 4.3 g/dL (ref 3.5–5.2)
Alkaline Phosphatase: 40 U/L (ref 39–117)
BUN: 9 mg/dL (ref 6–23)
CO2: 24 mEq/L (ref 19–32)
Calcium: 9.3 mg/dL (ref 8.4–10.5)
Chloride: 104 mEq/L (ref 96–112)
Creat: 0.8 mg/dL (ref 0.50–1.10)
GFR, Est African American: >89 mL/min
GFR, Est Non African American: >89 mL/min
Glucose, Bld: 78 mg/dL (ref 70–99)
Potassium: 4.1 mEq/L (ref 3.5–5.3)
Sodium: 137 mEq/L (ref 135–145)
Total Bilirubin: 0.5 mg/dL (ref 0.2–1.2)
Total Protein: 7.4 g/dL (ref 6.0–8.3)

## 2013-08-29 LAB — T4, FREE: Free T4: 1.18 ng/dL (ref 0.80–1.80)

## 2013-09-05 ENCOUNTER — Ambulatory Visit (INDEPENDENT_AMBULATORY_CARE_PROVIDER_SITE_OTHER): Payer: BC Managed Care – PPO | Admitting: Physician Assistant

## 2013-09-05 ENCOUNTER — Other Ambulatory Visit: Payer: Self-pay | Admitting: Physician Assistant

## 2013-09-05 VITALS — BP 116/68 | HR 74 | Temp 98.0°F | Resp 16 | Ht 66.0 in | Wt 136.6 lb

## 2013-09-05 DIAGNOSIS — R197 Diarrhea, unspecified: Secondary | ICD-10-CM

## 2013-09-05 LAB — POCT CBC
GRANULOCYTE PERCENT: 52.1 % (ref 37–80)
HEMATOCRIT: 38.6 % (ref 37.7–47.9)
HEMOGLOBIN: 12.7 g/dL (ref 12.2–16.2)
Lymph, poc: 2 (ref 0.6–3.4)
MCH: 30.8 pg (ref 27–31.2)
MCHC: 32.9 g/dL (ref 31.8–35.4)
MCV: 93.8 fL (ref 80–97)
MID (cbc): 0.8 (ref 0–0.9)
MPV: 11.6 fL (ref 0–99.8)
POC Granulocyte: 3 (ref 2–6.9)
POC LYMPH PERCENT: 34.2 %L (ref 10–50)
POC MID %: 13.7 % — AB (ref 0–12)
Platelet Count, POC: 165 10*3/uL (ref 142–424)
RBC: 4.12 M/uL (ref 4.04–5.48)
RDW, POC: 12.6 %
WBC: 5.8 10*3/uL (ref 4.6–10.2)

## 2013-09-05 NOTE — Progress Notes (Signed)
   Subjective:    Patient ID: Colleen Hendrix, female    DOB: 02/18/1981, 33 y.o.   MRN: [id number]  HPI Pt presents to clinic with continued diarrhea - she has had diarrhea for 16 days and it does not seem to be changing.  She has 3-4 watery stools a day - they occur after adb cramping.  She has noticed no blood in the stool.  She states that the stool is slight frothy - she states that is does have a foul odor.  She has been around no one with similar symptoms.  She works in an office with about 5 other people.  She has had no recent travel.  She has no recent abx.  She has had no nausea or vomiting.  She has h/o lactose intolerance but does not eat mil products.  She has been using Imodium and that helps slow down the diarrhea.  After she has taken it for a couple of days her stool with start to solidify but then as soon as she stops it the watery diarrhea returns.  She has been mainly drinking water, any time she eats something she knows about 4 hours later she will have cramping with diarrhea.  She has had no fever.  She has not been under a lot more stress than normal.  Review of Systems  Constitutional: Negative for fever and chills.  Gastrointestinal: Positive for abdominal pain (cramping) and diarrhea. Negative for nausea, vomiting and blood in stool.       Objective:   Physical Exam  Vitals reviewed. Constitutional: She is oriented to person, place, and time. She appears well-developed and well-nourished.  HENT:  Head: Normocephalic and atraumatic.  Right Ear: External ear normal.  Left Ear: External ear normal.  Eyes: Conjunctivae are normal.  Cardiovascular: Normal rate, regular rhythm and normal heart sounds.   No murmur heard. Pulmonary/Chest: Effort normal and breath sounds normal. She has no wheezes.  Neurological: She is alert and oriented to person, place, and time.  Skin: Skin is warm and dry.  Psychiatric: She has a normal mood and affect. Her behavior is normal. Judgment  and thought content normal.        Assessment & Plan:  Diarrhea - Plan: POCT CBC, COMPLETE METABOLIC PANEL WITH GFR, Stool culture, Clostridium difficile EIA, Ova and parasite examination, Ova and parasite examination, Ova and parasite examination  Due to the length of time she has had diarrhea we will do stool cultures.  If they are neg she will need a referral to [facility].  Benny Lennert PA-C  [facility] 09/05/2013 10:27 PM

## 2013-09-05 NOTE — Patient Instructions (Signed)
Continue to push fluids like you are currently doing. Please collect the stool samples and once we get the results we will make the next decision.

## 2013-09-06 LAB — COMPLETE METABOLIC PANEL WITH GFR
ALBUMIN: 3.8 g/dL (ref 3.5–5.2)
ALT: 18 U/L (ref 0–35)
AST: 19 U/L (ref 0–37)
Alkaline Phosphatase: 42 U/L (ref 39–117)
BUN: 9 mg/dL (ref 6–23)
CHLORIDE: 103 meq/L (ref 96–112)
CO2: 26 meq/L (ref 19–32)
CREATININE: 0.81 mg/dL (ref 0.50–1.10)
Calcium: 8.6 mg/dL (ref 8.4–10.5)
GFR, Est Non African American: >89 mL/min
GLUCOSE: 76 mg/dL (ref 70–99)
POTASSIUM: 4.1 meq/L (ref 3.5–5.3)
Sodium: 138 mEq/L (ref 135–145)
TOTAL PROTEIN: 6.8 g/dL (ref 6.0–8.3)
Total Bilirubin: 0.3 mg/dL (ref 0.2–1.2)

## 2013-09-07 LAB — C. DIFFICILE GDH AND TOXIN A/B
C. DIFF TOXIN A/B: DETECTED — AB
C. DIFFICILE GDH: NOT DETECTED

## 2013-09-07 LAB — CLOSTRIDIUM DIFFICILE BY PCR: CDIFFPCR: NOT DETECTED

## 2013-09-09 LAB — OVA AND PARASITE EXAMINATION
OP: NONE SEEN
OP: NONE SEEN

## 2013-09-10 LAB — OVA AND PARASITE EXAMINATION: OP: NONE SEEN

## 2013-09-10 LAB — STOOL CULTURE

## 2013-09-13 ENCOUNTER — Telehealth: Payer: Self-pay

## 2013-09-13 NOTE — Telephone Encounter (Signed)
Patient is wondering if her sxs are from her gall bladder.  She is only having trouble at night.  Do you have a specialist for her to see or does she need to pick someone?

## 2013-09-16 ENCOUNTER — Other Ambulatory Visit: Payer: Self-pay | Admitting: Physician Assistant

## 2013-09-16 DIAGNOSIS — R197 Diarrhea, unspecified: Secondary | ICD-10-CM

## 2013-09-16 NOTE — Telephone Encounter (Signed)
I did a referral for Gi to [facility].  I would be doubtful about the Gallbladder because she is not having pain but it is possible.

## 2013-09-17 NOTE — Telephone Encounter (Signed)
Advised pt

## 2013-09-24 ENCOUNTER — Encounter: Payer: Self-pay | Admitting: Internal Medicine

## 2013-09-30 ENCOUNTER — Ambulatory Visit (INDEPENDENT_AMBULATORY_CARE_PROVIDER_SITE_OTHER): Payer: BC Managed Care – PPO | Admitting: Internal Medicine

## 2013-09-30 ENCOUNTER — Encounter: Payer: Self-pay | Admitting: Internal Medicine

## 2013-09-30 VITALS — BP 102/74 | HR 80 | Ht 66.5 in | Wt 133.4 lb

## 2013-09-30 DIAGNOSIS — R112 Nausea with vomiting, unspecified: Secondary | ICD-10-CM

## 2013-09-30 DIAGNOSIS — R197 Diarrhea, unspecified: Secondary | ICD-10-CM

## 2013-09-30 MED ORDER — DICYCLOMINE HCL 20 MG PO TABS: 20.0000 mg | ORAL_TABLET | Freq: Three times a day (TID) | ORAL | Status: DC

## 2013-09-30 NOTE — Progress Notes (Signed)
Subjective:    Patient ID: Colleen Hendrix, female    DOB: 1980-09-16, 33 y.o.   MRN: [id number]  HPI The patient is a very pleasant 33 year old woman who was well until middle of May when she developed a diarrheal illness associated with some nausea and vomiting. She had eaten the same thing coworkers and her husband and he never 2 days and none of them got sick. She had profuse watery diarrhea, after week she presented to urgent medical and family care, diagnosis of most likely viral gastroenteritis made. After persistent problems stool studies for culture, C. difficile toxins, ova and parasites were performed and the C. difficile toxin study was positive but PCR subsequently performed was negative. The diarrhea is somewhat better but in general she continues with watery stools that are urgent both during the day and at night. She has had some normal bowel movements. There is no fever or bleeding or abdomen is sore. She has also had some intermittent nausea and vomiting. This is been immediately after eating. There is no history of chronic diarrhea. She also had a thyroid test checked and was okay as well as a CBC. He does have a diagnosis of probable lactose intolerance. This is different than that. She is otherwise been very healthy. She did not have any foreign travel though she went to [place] one point. There have been no recent antibiotics.  Allergies  Allergen Reactions  . Lactose Intolerance (Gi)    Outpatient Prescriptions Prior to Visit  Medication Sig Dispense Refill  . Cetirizine HCl (ZYRTEC ALLERGY PO) Take by mouth.      . folic acid (FOLVITE) 1 MG tablet Take 1 tablet (1 mg total) by mouth daily.  30 tablet  0  . Loratadine (CLARITIN PO) Take by mouth.      . Norethindrone Acetate-Ethinyl Estrad-FE (LOESTRIN 24 FE) 1-20 MG-MCG(24) tablet Pt to take 1 tab po for 12 wks of active pills then 3days off  3 Package  3  . ipratropium (ATROVENT) 0.06 % nasal spray Place 2 sprays  into the nose 3 (three) times daily.  15 mL  3  . meclizine (ANTIVERT) 25 MG tablet Take 1 tablet (25 mg total) by mouth 3 (three) times daily as needed for dizziness or nausea. Especially hs  20 tablet  0   No facility-administered medications prior to visit.   Past Medical History  Diagnosis Date  . Lactose intolerance    Past Surgical History  Procedure Laterality Date  . Lasik Bilateral   . Shoulder surgery Right    History   Social History  . Marital Status: Single    Spouse Name: N/A    Number of Children: 0  . 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 Smoking status: Never Smoker   . Smokeless tobacco: Never Used  . Alcohol Use: 0.0 oz/week     Comment: once a week-wine  . Drug Use: No  . Sexual Activity: Yes    Birth Control/ Protection: Pill    Social History Narrative  . Single, without children. She is a Warehouse managervice president of an Scientist, [employer] company. One alcoholic beverage a day 1-2 caffeinated beverages daily.   Family History  Problem Relation Age of Onset  . Breast cancer Mother   . Hypertension Mother   . Heart disease Maternal Grandfather   . Hypertension Maternal Grandfather   .  Lung cancer Paternal Grandfather   . Stroke Paternal Grandfather   . Diabetes Maternal Grandmother   . Melanoma Paternal Grandfather    Review of Systems Positive for those things mentioned in the history of present illness as well as seasonal allergies. All other review of systems are negative.    Objective:   Physical Exam General:  Well-developed, well-nourished and in no acute distress Eyes:  anicteric. ENT:   Mouth and posterior pharynx free of lesions.  Neck:   supple w/o thyromegaly or mass.  Lungs: Clear to auscultation bilaterally. Heart:  S1S2, no rubs, murmurs, gallops. Abdomen:  soft, non-tender, no hepatosplenomegaly, hernia, or mass and BS+.  Rectal: deferred Lymph:  no cervical or supraclavicular  adenopathy. Extremities:   no edema Skin   no rash. Neuro:  A&O x 3.  Psych:  appropriate mood and  Affect.   Data Reviewed:  As per history of present illness and reviewed urgent medical and family notes and labs in the medical record.      Assessment & Plan:   1. Diarrhea   2. Nausea and vomiting, vomiting of unspecified type    1. Chest chronic diarrhea but does not appear to be infectious in origin. It could be a post infectious irritable bowel syndrome but given the chronicity colonoscopy with terminal ileum inspection and possible random biopsies is appropriate to look for more but chronic inflammatory bowel disease-type process or other causes. 2. Trial of dicyclomine 20 mg in the interim. 3. The risks and benefits as well as alternatives of endoscopic procedure(s) have been discussed and reviewed. All questions answered. The patient agrees to proceed.

## 2013-09-30 NOTE — Patient Instructions (Addendum)
You have been scheduled for a colonoscopy. Please follow written instructions given to you at your visit today.  Please pick up your prep supplies at the pharmacy. If you use inhalers (even only as needed), please bring them with you on the day of your procedure. Your physician has requested that you go to www.startemmi.com and enter the access code given to you at your visit today. This web site gives a general overview about your procedure. However, you should still follow specific instructions given to you by our office regarding your preparation for the procedure.   We have sent the following medications to your pharmacy for you to pick up at your convenience: Dicyclomine   I appreciate the opportunity to care for you.

## 2013-10-01 ENCOUNTER — Telehealth: Payer: Self-pay

## 2013-10-01 NOTE — Telephone Encounter (Signed)
Patient unable to move colonoscopy appointment up. When  I reached her she was at the store, currently having plumbing issues at her house and didn't feel like she could change the appointment.

## 2013-10-01 NOTE — Telephone Encounter (Signed)
ok 

## 2013-10-01 NOTE — Telephone Encounter (Signed)
Message copied by SwazilandJORDAN, PATTI E on Tue Oct 01, 2013  5:08 PM ------      Message from: Iva BoopGESSNER, CARL E      Created: Tue Oct 01, 2013  2:28 PM      Regarding: opening Thursday this week       Let her know we now have 2PM slot Thursday open (in 2 days) and if she wants it can have that spot for colonoscopy and cancel 7/2 ------

## 2013-10-04 ENCOUNTER — Encounter: Payer: Self-pay | Admitting: Internal Medicine

## 2013-10-08 ENCOUNTER — Telehealth: Payer: Self-pay

## 2013-10-08 NOTE — Telephone Encounter (Signed)
Message copied by [place]JORDAN, Nyleah Mcginnis E on Tue Oct 08, 2013  9:18 AM ------      Message from: Iva BoopGESSNER, CARL E      Created: Tue Oct 08, 2013  3:54 AM      Regarding: change olonoscopy time       Due to cancellations 7/2 she can change her time from 430 PM - please do this and adjust prep timing ------

## 2013-10-08 NOTE — Telephone Encounter (Signed)
Moved patients colonoscopy appointment from 4:30pm to 2:30pm which said will work better for her ride.  Discussed changing the prep times and she verbalized understanding.

## 2013-10-10 ENCOUNTER — Encounter: Payer: Self-pay | Admitting: Internal Medicine

## 2013-10-10 ENCOUNTER — Ambulatory Visit (AMBULATORY_SURGERY_CENTER): Payer: BC Managed Care – PPO | Admitting: Internal Medicine

## 2013-10-10 ENCOUNTER — Encounter: Payer: BC Managed Care – PPO | Admitting: Internal Medicine

## 2013-10-10 VITALS — BP 112/77 | HR 63 | Temp 98.7°F | Resp 20 | Ht 66.5 in | Wt 133.0 lb

## 2013-10-10 DIAGNOSIS — R197 Diarrhea, unspecified: Secondary | ICD-10-CM

## 2013-10-10 MED ORDER — SODIUM CHLORIDE 0.9 % IV SOLN: 500.0000 mL | INTRAVENOUS | Status: DC

## 2013-10-10 NOTE — Op Note (Addendum)
[facility] [address] [place], [id number]   COLONOSCOPY PROCEDURE REPORT  PATIENT: [name], Colleen  MR#: [id number] BIRTHDATE: 1980-07-19 , 33  yrs. old GENDER: Female ENDOSCOPIST: Iva Booparl E Gessner, MD, [facility] REFERRED BY: PROCEDURE DATE:  10/10/2013 PROCEDURE:   Colonoscopy with biopsy First Screening Colonoscopy - Avg.  risk and is 50 yrs.  old or older - No.  Prior Negative Screening - Now for repeat screening. N/A  History of Adenoma - Now for follow-up colonoscopy & has been > or = to 3 yrs.  N/A  Polyps Removed Today? No.  Recommend repeat exam, <10 yrs? No. ASA CLASS:   Class I INDICATIONS:unexplained diarrhea. MEDICATIONS: Propofol (Diprivan) 260 mg IV, MAC sedation, administered by CRNA, and These medications were titrated to patient response per physician's verbal order  DESCRIPTION OF PROCEDURE:   After the risks benefits and alternatives of the procedure were thoroughly explained, informed consent was obtained.  A digital rectal exam revealed no abnormalities of the rectum.   The LB [id number]  endoscope was introduced through the anus and advanced to the terminal ileum which was intubated for a short distance. No adverse events experienced.   The quality of the prep was excellent, using MiraLax The instrument was then slowly withdrawn as the colon was fully examined.      COLON FINDINGS: The mucosa appeared normal in the terminal ileum. A normal appearing cecum, ileocecal valve, and appendiceal orifice were identified.  The ascending, hepatic flexure, transverse, splenic flexure, descending, sigmoid colon and rectum appeared unremarkable.  No polyps or cancers were seen.  Multiple random biopsies of the area were performed.  Retroflexed views revealed no abnormalities. The time to cecum=2 minutes 31 seconds.  Withdrawal time=7 minutes 58 seconds.  The scope was withdrawn and the procedure completed. COMPLICATIONS: There were no  complications.  ENDOSCOPIC IMPRESSION: 1.   Normal mucosa in the terminal ileum 2.   Normal colon; multiple random biopsies of the area were performed  RECOMMENDATIONS: Office will call with the results.   eSigned:  Iva Booparl E Gessner, MD, [facility] 10/10/2013 2:47 PM   cc: The Patient  and Benny LennertSarah Weber, PA-C

## 2013-10-10 NOTE — Progress Notes (Signed)
Called to room to assist during endoscopic procedure.  Patient ID and intended procedure confirmed with present staff. Received instructions for my participation in the procedure from the performing physician.  

## 2013-10-10 NOTE — Progress Notes (Signed)
Procedure ends, to recovery, report given and VSS. 

## 2013-10-10 NOTE — Patient Instructions (Addendum)
The colonoscopy looked ok. I took biopsies to see if you have microscopic inflammation or cloitis. We will call with results and plans.  I appreciate the opportunity to care for you. Iva Booparl E. Sabrina Arriaga, MD, FACG   YOU HAD AN ENDOSCOPIC PROCEDURE TODAY AT THE [facility]: Refer to the procedure report that was given to you for any specific questions about what was found during the examination.  If the procedure report does not answer your questions, please call your gastroenterologist to clarify.  If you requested that your care partner not be given the details of your procedure findings, then the procedure report has been included in a sealed envelope for you to review at your convenience later.  YOU SHOULD EXPECT: Some feelings of bloating in the abdomen. Passage of more gas than usual.  Walking can help get rid of the air that was put into your GI tract during the procedure and reduce the bloating. If you had a lower endoscopy (such as a colonoscopy or flexible sigmoidoscopy) you may notice spotting of blood in your stool or on the toilet paper. If you underwent a bowel prep for your procedure, then you may not have a normal bowel movement for a few days.  DIET: Your first meal following the procedure should be a light meal and then it is ok to progress to your normal diet.  A half-sandwich or bowl of soup is an example of a good first meal.  Heavy or fried foods are harder to digest and may make you feel nauseous or bloated.  Likewise meals heavy in dairy and vegetables can cause extra gas to form and this can also increase the bloating.  Drink plenty of fluids but you should avoid alcoholic beverages for 24 hours.  ACTIVITY: Your care partner should take you home directly after the procedure.  You should plan to take it easy, moving slowly for the rest of the day.  You can resume normal activity the day after the procedure however you should NOT DRIVE or use heavy machinery for 24 hours  (because of the sedation medicines used during the test).    SYMPTOMS TO REPORT IMMEDIATELY: A gastroenterologist can be reached at any hour.  During normal business hours, 8:30 AM to 5:00 PM Monday through Friday, call [phone].  After hours and on weekends, please call the GI answering service at [phone] who will take a message and have the physician on call contact you.   Following lower endoscopy (colonoscopy or flexible sigmoidoscopy):  Excessive amounts of blood in the stool  Significant tenderness or worsening of abdominal pains  Swelling of the abdomen that is new, acute  Fever of 100F or higher    FOLLOW UP: If any biopsies were taken you will be contacted by phone or by letter within the next 1-3 weeks.  Call your gastroenterologist if you have not heard about the biopsies in 3 weeks.  Our staff will call the home number listed on your records the next business day following your procedure to check on you and address any questions or concerns that you may have at that time regarding the information given to you following your procedure. This is a courtesy call and so if there is no answer at the home number and we have not heard from you through the emergency physician on call, we will assume that you have returned to your regular daily activities without incident.  SIGNATURES/CONFIDENTIALITY: You and/or your care partner have signed  paperwork which will be entered into your electronic medical record.  These signatures attest to the fact that that the information above on your After Visit Summary has been reviewed and is understood.  Full responsibility of the confidentiality of this discharge information lies with you and/or your care-partner.

## 2013-10-14 ENCOUNTER — Telehealth: Payer: Self-pay

## 2013-10-14 NOTE — Telephone Encounter (Signed)
Left message on answering machine. 

## 2013-10-18 NOTE — Progress Notes (Signed)
Quick Note:  Normal colon biopsies - no colitis Dx is IBS Notify by My Chart No letter needed Recall colonoscopy 2025 ______

## 2013-10-24 ENCOUNTER — Encounter: Payer: Self-pay | Admitting: Internal Medicine

## 2014-10-08 ENCOUNTER — Other Ambulatory Visit: Payer: Self-pay | Admitting: Obstetrics and Gynecology

## 2014-10-10 LAB — CYTOLOGY - PAP

## 2014-10-14 ENCOUNTER — Ambulatory Visit (INDEPENDENT_AMBULATORY_CARE_PROVIDER_SITE_OTHER): Payer: BLUE CROSS/BLUE SHIELD | Admitting: Family Medicine

## 2014-10-14 VITALS — BP 124/84 | HR 81 | Temp 98.4°F | Resp 18 | Ht 67.0 in | Wt 133.0 lb

## 2014-10-14 DIAGNOSIS — J029 Acute pharyngitis, unspecified: Secondary | ICD-10-CM

## 2014-10-14 DIAGNOSIS — N898 Other specified noninflammatory disorders of vagina: Secondary | ICD-10-CM | POA: Diagnosis not present

## 2014-10-14 DIAGNOSIS — R300 Dysuria: Secondary | ICD-10-CM | POA: Diagnosis not present

## 2014-10-14 LAB — POCT URINALYSIS DIPSTICK
Bilirubin, UA: NEGATIVE
Glucose, UA: NEGATIVE
Ketones, UA: NEGATIVE
Nitrite, UA: NEGATIVE
Protein, UA: NEGATIVE
Spec Grav, UA: <=1.005
UROBILINOGEN UA: 0.2
pH, UA: 5.5

## 2014-10-14 LAB — POCT UA - MICROSCOPIC ONLY
Casts, Ur, LPF, POC: NEGATIVE
Crystals, Ur, HPF, POC: NEGATIVE
Mucus, UA: NEGATIVE
YEAST UA: NEGATIVE

## 2014-10-14 LAB — POCT RAPID STREP A (OFFICE): Rapid Strep A Screen: NEGATIVE

## 2014-10-14 LAB — POCT WET PREP WITH KOH
CLUE CELLS WET PREP PER HPF POC: NEGATIVE
KOH Prep POC: NEGATIVE
Trichomonas, UA: NEGATIVE
Yeast Wet Prep HPF POC: NEGATIVE

## 2014-10-14 NOTE — Progress Notes (Addendum)
Subjective:   This chart was scribed for Nilda SimmerKristi Analisa Sledd, MD by Jarvis Morganaylor Ferguson, ED Scribe. This patient was seen in Room 5 and the patient's care was started at 7:29 PM.   Patient ID: Colleen Hendrix, female    DOB: [birth year], 34 y.o.   MRN: [id number]  10/14/2014  Urinary Tract Infection and Fatigue   HPI  HPI Comments: Colleen Hendrix is a 34 y.o. female who presents to the [facility] complaining of constant, moderate, lower abdominal pain for 2 days. She reports associated dysuria, lower back pain, burning around opening of the vagina, "foggy headedness" and urinary frequency. She reports she got up 3x in the middle of the night last night and usually only gets up maybe 1x to urinate. She notes that she has a mild sore throat but states she has seasonal allergies so that may be the cause. Pt states she took an at home UTI test that she got from the drug store and it showed she had a high WBC. She admits she has been taking Estrace for vaginal atrophy. She denies any worry for STIs and states she has had the same female partner for 4 years. She denies any fever, chills, urinary urgency, vaginal odor, nausea, vomiting, hematuria, cough or constipation.  No tick bites but did pull a tick off of her leg a few weeks ago that was crawling on her.  Review of Systems  Constitutional: Negative for fever, chills, diaphoresis and fatigue.  HENT: Positive for sore throat. Negative for congestion, ear pain, postnasal drip, rhinorrhea, sneezing, trouble swallowing and voice change.   Respiratory: Negative for cough and shortness of breath.   Gastrointestinal: Positive for abdominal pain (lower). Negative for nausea, vomiting and constipation.  Genitourinary: Positive for dysuria, frequency, vaginal pain and pelvic pain. Negative for urgency, hematuria, flank pain, vaginal bleeding, vaginal discharge and difficulty urinating.  Musculoskeletal: Positive for back pain (lower).  Skin: Negative for  rash.    Past Medical History  Diagnosis Date  . Lactose intolerance   . Allergy    Past Surgical History  Procedure Laterality Date  . Lasik Bilateral   . Shoulder surgery Right    Allergies  Allergen Reactions  . Lactose Intolerance (Gi)    Current Outpatient Prescriptions  Medication Sig Dispense Refill  . Cetirizine HCl (ZYRTEC ALLERGY PO) Take by mouth.    . estradiol (ESTRACE) 0.1 MG/GM vaginal cream Place 1 Applicatorful vaginally at bedtime.    . folic acid (FOLVITE) 1 MG tablet Take 1 tablet (1 mg total) by mouth daily. 30 tablet 0  . Lactobacillus Rhamnosus, GG, (CULTURELLE PO) Take 1 capsule by mouth daily.    . norethindrone-ethinyl estradiol-iron (MICROGESTIN FE,GILDESS FE,LOESTRIN FE) 1.5-30 MG-MCG tablet Take 1 tablet by mouth daily.    Marland Kitchen. dicyclomine (BENTYL) 20 MG tablet Take 1 tablet (20 mg total) by mouth 4 (four) times daily -  before meals and at bedtime. (Patient not taking: Reported on 10/14/2014) 120 tablet 0  . Loratadine (CLARITIN PO) Take by mouth.     No current facility-administered medications for this visit.   History   Social History  . Marital Status: Single    Spouse Name: N/A  . Number of Children: 0  . 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 Smoking status: Never Smoker   . Smokeless tobacco: Never Used  . Alcohol Use: 0.0 oz/week  0 Standard drinks or equivalent per week     Comment: once a week-wine  . Drug Use: No  . Sexual Activity: Yes    Birth Control/ Protection: Pill   Other Topics Concern  . Not on file   Social History Narrative   Single, without children.   She is a Warehouse manager of an Scientist, forensic. One alcoholic beverage a day 1-2 caffeinated beverages daily.       Objective:    Triage Vitals: BP 124/84 mmHg  Pulse 81  Temp(Src) 98.4 F (36.9 C) (Oral)  Resp 18  Ht 5\' 7"  (1.702 m)  Wt 133 lb (60.328 kg)  BMI 20.83 kg/m2  SpO2  98%  Physical Exam  Constitutional: She is oriented to person, place, and time. She appears well-developed and well-nourished. No distress.  HENT:  Head: Normocephalic and atraumatic.  Right Ear: Tympanic membrane, external ear and ear canal normal.  Left Ear: Tympanic membrane, external ear and ear canal normal.  Nose: Nose normal.  Mouth/Throat: Mucous membranes are normal. Posterior oropharyngeal erythema (no cobblestone ) present. No oropharyngeal exudate, posterior oropharyngeal edema or tonsillar abscesses.  Eyes: Conjunctivae and EOM are normal. Pupils are equal, round, and reactive to light.  Neck: Normal range of motion. Neck supple. Carotid bruit is not present. No tracheal deviation present. No thyromegaly present.  Cardiovascular: Normal rate, regular rhythm, normal heart sounds and intact distal pulses.  Exam reveals no gallop and no friction rub.   No murmur heard. Pulmonary/Chest: Effort normal and breath sounds normal. No respiratory distress. She has no wheezes. She has no rales.  Abdominal: Soft. Bowel sounds are normal. She exhibits no distension and no mass. There is tenderness (RLQ and periumbilical region). There is no rebound and no guarding.  Genitourinary: Uterus normal. There is no rash, tenderness, lesion or injury on the right labia. There is no rash, tenderness, lesion or injury on the left labia. Cervix exhibits motion tenderness (mild). Cervix exhibits no discharge and no friability. Right adnexum displays no mass, no tenderness and no fullness. Left adnexum displays tenderness (mild). Left adnexum displays no mass and no fullness. There is erythema (mild to labia and vaginal area) in the vagina. No bleeding in the vagina. No foreign body around the vagina. Vaginal discharge (moderate, thin and white) found.  Mild cervical motion tenderness; moderate amount of thin discharge in vaginal vault.  Erythema of labia minora and vaginal wall.  Musculoskeletal: Normal range  of motion.  Lymphadenopathy:    She has no cervical adenopathy.  Neurological: She is alert and oriented to person, place, and time. No cranial nerve deficit.  Skin: Skin is warm and dry. No rash noted. She is not diaphoretic. No erythema. No pallor.  Psychiatric: She has a normal mood and affect. Her behavior is normal.  Nursing note and vitals reviewed.  Results for orders placed or performed in visit on 10/14/14  POCT UA - Microscopic Only  Result Value Ref Range   WBC, Ur, HPF, POC 5-8    RBC, urine, microscopic 0-1    Bacteria, U Microscopic 3+    Mucus, UA Negative    Epithelial cells, urine per micros 1-5    Crystals, Ur, HPF, POC Negative    Casts, Ur, LPF, POC Negative    Yeast, UA Negative   Urinalysis Dipstick  Result Value Ref Range   Color, UA straw    Clarity, UA clear    Glucose, UA negative    Bilirubin, UA negative  Ketones, UA negative    Spec Grav, UA <=1.005    Blood, UA trace-lysed    pH, UA 5.5    Protein, UA negative    Urobilinogen, UA 0.2    Nitrite, UA negative    Leukocytes, UA Trace (A) Negative  POCT rapid strep A  Result Value Ref Range   Rapid Strep A Screen Negative Negative  POCT Wet Prep with KOH  Result Value Ref Range   Trichomonas, UA Negative    Clue Cells Wet Prep HPF POC Negative    Epithelial Wet Prep HPF POC Many Few, Moderate, Many   Yeast Wet Prep HPF POC Negative    Bacteria Wet Prep HPF POC Many (A) Few   RBC Wet Prep HPF POC 0-2    WBC Wet Prep HPF POC 20+    KOH Prep POC Negative       Assessment & Plan:   1. Burning with urination   2. Vaginal irritation   3. Sore throat     1. Dysuria: New. Send urine culture; pt prefers to await culture results prior to treating. 2.  Vaginal irritation: New. Send GC/Chlam; cervicitis present; pt desires to await cx results prior to treating. If GC/Chlam negative, would treat empirically with Doxy or Ofloxacin. 3. Sore throat: New. Send throat culture; recommend supportive  care with rest, fluids, Ibuprofen. RTC inability to swallow. 4. Atrophic vaginitis: chronic; recent increase in dose of estrogen vaginal suppository; current symptoms do not appear secondary to increased estrogen dose.   Meds ordered this encounter  Medications  . estradiol (ESTRACE) 0.1 MG/GM vaginal cream    Sig: Place 1 Applicatorful vaginally at bedtime.  . norethindrone-ethinyl estradiol-iron (MICROGESTIN FE,GILDESS FE,LOESTRIN FE) 1.5-30 MG-MCG tablet    Sig: Take 1 tablet by mouth daily.    No Follow-up on file.  I personally performed the services described in this documentation, which was scribed in my presence. The recorded information has been reviewed and considered.  Eiko Mcgowen Paulita Fujita, M.D. [facility] [address] [place], [place]  [address] [phone] phone [phone] fax

## 2014-10-16 ENCOUNTER — Encounter: Payer: Self-pay | Admitting: Family Medicine

## 2014-10-16 LAB — GC/CHLAMYDIA PROBE AMP
CT PROBE, AMP APTIMA: NEGATIVE
GC Probe RNA: NEGATIVE

## 2014-10-16 LAB — CULTURE, GROUP A STREP: Organism ID, Bacteria: NORMAL

## 2014-10-16 LAB — URINE CULTURE
Colony Count: NO GROWTH
Organism ID, Bacteria: NO GROWTH

## 2014-10-16 MED ORDER — AZITHROMYCIN 250 MG PO TABS: ORAL_TABLET | ORAL | Status: DC

## 2014-10-16 NOTE — Addendum Note (Signed)
Addended by: Ethelda ChickSMITH, Roberto Hlavaty M on: 10/16/2014 05:32 PM   Modules accepted: Orders

## 2014-10-18 ENCOUNTER — Telehealth: Payer: Self-pay | Admitting: Radiology

## 2014-10-18 MED ORDER — FLUCONAZOLE 150 MG PO TABS: 150.0000 mg | ORAL_TABLET | Freq: Once | ORAL | Status: DC

## 2014-10-18 NOTE — Telephone Encounter (Signed)
Please have patient take one dose and repeat 72 hours later if needed for antibiotic associated yeast infection. Thank you!

## 2014-10-31 ENCOUNTER — Other Ambulatory Visit: Payer: Self-pay | Admitting: Urgent Care

## 2014-10-31 NOTE — Telephone Encounter (Signed)
Dr Katrinka Blazing, do you want to RF this? Pt was given #2 tablets after her OV w/you and Abxs earlier in the month.

## 2014-11-01 NOTE — Telephone Encounter (Signed)
Diflucan was sent in on 10-18-2014 without OV due to recent abx therapy; thus, not willing to refill Diflucan again. Pt presented with complicated presentation with negative work up early in July; thus, recommend return OV or evaluation by gynecology.  Please advise pt of such.

## 2016-11-29 ENCOUNTER — Other Ambulatory Visit: Payer: Self-pay | Admitting: Obstetrics and Gynecology

## 2016-11-29 DIAGNOSIS — N6320 Unspecified lump in the left breast, unspecified quadrant: Secondary | ICD-10-CM

## 2016-12-02 ENCOUNTER — Ambulatory Visit
Admission: RE | Admit: 2016-12-02 | Discharge: 2016-12-02 | Disposition: A | Discharge status: Home / Self Care | Payer: 59 | Source: Ambulatory Visit | Attending: Obstetrics and Gynecology | Admitting: Obstetrics and Gynecology

## 2016-12-02 DIAGNOSIS — N6320 Unspecified lump in the left breast, unspecified quadrant: Secondary | ICD-10-CM

## 2019-07-17 ENCOUNTER — Other Ambulatory Visit: Payer: Self-pay | Admitting: Obstetrics and Gynecology

## 2019-07-17 DIAGNOSIS — Z1231 Encounter for screening mammogram for malignant neoplasm of breast: Secondary | ICD-10-CM

## 2019-08-30 ENCOUNTER — Ambulatory Visit
Admission: RE | Admit: 2019-08-30 | Discharge: 2019-08-30 | Disposition: A | Discharge status: Home / Self Care | Payer: 59 | Source: Ambulatory Visit | Attending: Obstetrics and Gynecology | Admitting: Obstetrics and Gynecology

## 2019-08-30 ENCOUNTER — Other Ambulatory Visit: Payer: Self-pay

## 2019-08-30 DIAGNOSIS — Z1231 Encounter for screening mammogram for malignant neoplasm of breast: Secondary | ICD-10-CM

## 2019-12-26 DIAGNOSIS — T1502XD Foreign body in cornea, left eye, subsequent encounter: Secondary | ICD-10-CM | POA: Diagnosis not present

## 2019-12-26 DIAGNOSIS — H04123 Dry eye syndrome of bilateral lacrimal glands: Secondary | ICD-10-CM | POA: Diagnosis not present

## 2020-01-03 DIAGNOSIS — Z20822 Contact with and (suspected) exposure to covid-19: Secondary | ICD-10-CM | POA: Diagnosis not present

## 2020-06-29 DIAGNOSIS — L814 Other melanin hyperpigmentation: Secondary | ICD-10-CM | POA: Diagnosis not present

## 2020-06-29 DIAGNOSIS — D225 Melanocytic nevi of trunk: Secondary | ICD-10-CM | POA: Diagnosis not present

## 2020-06-29 DIAGNOSIS — L700 Acne vulgaris: Secondary | ICD-10-CM | POA: Diagnosis not present

## 2020-06-29 DIAGNOSIS — L905 Scar conditions and fibrosis of skin: Secondary | ICD-10-CM | POA: Diagnosis not present

## 2020-07-20 ENCOUNTER — Other Ambulatory Visit: Payer: Self-pay | Admitting: Obstetrics and Gynecology

## 2020-07-20 DIAGNOSIS — Z1231 Encounter for screening mammogram for malignant neoplasm of breast: Secondary | ICD-10-CM

## 2020-08-07 DIAGNOSIS — M7672 Peroneal tendinitis, left leg: Secondary | ICD-10-CM | POA: Diagnosis not present

## 2020-08-07 DIAGNOSIS — M216X2 Other acquired deformities of left foot: Secondary | ICD-10-CM | POA: Diagnosis not present

## 2020-08-12 DIAGNOSIS — M25572 Pain in left ankle and joints of left foot: Secondary | ICD-10-CM | POA: Diagnosis not present

## 2020-08-17 DIAGNOSIS — M25572 Pain in left ankle and joints of left foot: Secondary | ICD-10-CM | POA: Diagnosis not present

## 2020-08-20 DIAGNOSIS — M25572 Pain in left ankle and joints of left foot: Secondary | ICD-10-CM | POA: Diagnosis not present

## 2020-08-24 DIAGNOSIS — M25572 Pain in left ankle and joints of left foot: Secondary | ICD-10-CM | POA: Diagnosis not present

## 2020-08-28 DIAGNOSIS — M25571 Pain in right ankle and joints of right foot: Secondary | ICD-10-CM | POA: Diagnosis not present

## 2020-09-01 DIAGNOSIS — M25572 Pain in left ankle and joints of left foot: Secondary | ICD-10-CM | POA: Diagnosis not present

## 2020-09-04 ENCOUNTER — Ambulatory Visit
Admission: RE | Admit: 2020-09-04 | Discharge: 2020-09-04 | Disposition: A | Discharge status: Home / Self Care | Payer: BC Managed Care – PPO | Source: Ambulatory Visit

## 2020-09-04 ENCOUNTER — Other Ambulatory Visit: Payer: Self-pay

## 2020-09-04 DIAGNOSIS — Z1231 Encounter for screening mammogram for malignant neoplasm of breast: Secondary | ICD-10-CM

## 2020-09-08 DIAGNOSIS — M25572 Pain in left ankle and joints of left foot: Secondary | ICD-10-CM | POA: Diagnosis not present

## 2020-09-14 DIAGNOSIS — M25572 Pain in left ankle and joints of left foot: Secondary | ICD-10-CM | POA: Diagnosis not present

## 2020-12-17 DIAGNOSIS — Z01419 Encounter for gynecological examination (general) (routine) without abnormal findings: Secondary | ICD-10-CM | POA: Diagnosis not present

## 2020-12-17 DIAGNOSIS — Z681 Body mass index (BMI) 19 or less, adult: Secondary | ICD-10-CM | POA: Diagnosis not present

## 2020-12-25 DIAGNOSIS — L718 Other rosacea: Secondary | ICD-10-CM | POA: Diagnosis not present

## 2021-01-05 DIAGNOSIS — L309 Dermatitis, unspecified: Secondary | ICD-10-CM | POA: Diagnosis not present

## 2021-01-05 DIAGNOSIS — L508 Other urticaria: Secondary | ICD-10-CM | POA: Diagnosis not present

## 2021-01-12 DIAGNOSIS — L501 Idiopathic urticaria: Secondary | ICD-10-CM | POA: Diagnosis not present

## 2021-01-12 DIAGNOSIS — J309 Allergic rhinitis, unspecified: Secondary | ICD-10-CM | POA: Diagnosis not present

## 2021-01-12 DIAGNOSIS — T783XXD Angioneurotic edema, subsequent encounter: Secondary | ICD-10-CM | POA: Diagnosis not present

## 2021-01-28 DIAGNOSIS — H0102A Squamous blepharitis right eye, upper and lower eyelids: Secondary | ICD-10-CM | POA: Diagnosis not present

## 2021-01-28 DIAGNOSIS — H0102B Squamous blepharitis left eye, upper and lower eyelids: Secondary | ICD-10-CM | POA: Diagnosis not present

## 2021-01-28 DIAGNOSIS — H04123 Dry eye syndrome of bilateral lacrimal glands: Secondary | ICD-10-CM | POA: Diagnosis not present

## 2021-01-28 DIAGNOSIS — H1045 Other chronic allergic conjunctivitis: Secondary | ICD-10-CM | POA: Diagnosis not present

## 2021-03-13 DIAGNOSIS — L509 Urticaria, unspecified: Secondary | ICD-10-CM | POA: Diagnosis not present

## 2021-03-23 DIAGNOSIS — J302 Other seasonal allergic rhinitis: Secondary | ICD-10-CM | POA: Diagnosis not present

## 2021-03-23 DIAGNOSIS — L719 Rosacea, unspecified: Secondary | ICD-10-CM | POA: Diagnosis not present

## 2021-03-23 DIAGNOSIS — E538 Deficiency of other specified B group vitamins: Secondary | ICD-10-CM | POA: Diagnosis not present

## 2021-03-23 DIAGNOSIS — Z1329 Encounter for screening for other suspected endocrine disorder: Secondary | ICD-10-CM | POA: Diagnosis not present

## 2021-03-23 DIAGNOSIS — E559 Vitamin D deficiency, unspecified: Secondary | ICD-10-CM | POA: Diagnosis not present

## 2021-03-23 DIAGNOSIS — Z131 Encounter for screening for diabetes mellitus: Secondary | ICD-10-CM | POA: Diagnosis not present

## 2021-03-23 DIAGNOSIS — L299 Pruritus, unspecified: Secondary | ICD-10-CM | POA: Diagnosis not present

## 2021-03-25 DIAGNOSIS — L508 Other urticaria: Secondary | ICD-10-CM | POA: Diagnosis not present

## 2021-03-25 DIAGNOSIS — L700 Acne vulgaris: Secondary | ICD-10-CM | POA: Diagnosis not present

## 2021-03-25 DIAGNOSIS — L501 Idiopathic urticaria: Secondary | ICD-10-CM | POA: Diagnosis not present

## 2021-03-25 DIAGNOSIS — L718 Other rosacea: Secondary | ICD-10-CM | POA: Diagnosis not present

## 2021-05-04 DIAGNOSIS — L299 Pruritus, unspecified: Secondary | ICD-10-CM | POA: Diagnosis not present

## 2021-05-04 DIAGNOSIS — L719 Rosacea, unspecified: Secondary | ICD-10-CM | POA: Diagnosis not present

## 2021-05-04 DIAGNOSIS — E538 Deficiency of other specified B group vitamins: Secondary | ICD-10-CM | POA: Diagnosis not present

## 2021-05-04 DIAGNOSIS — J302 Other seasonal allergic rhinitis: Secondary | ICD-10-CM | POA: Diagnosis not present

## 2021-05-17 DIAGNOSIS — Z309 Encounter for contraceptive management, unspecified: Secondary | ICD-10-CM | POA: Diagnosis not present

## 2021-06-02 ENCOUNTER — Other Ambulatory Visit: Payer: Self-pay

## 2021-06-02 ENCOUNTER — Encounter: Payer: Self-pay | Admitting: Physician Assistant

## 2021-06-02 ENCOUNTER — Ambulatory Visit (INDEPENDENT_AMBULATORY_CARE_PROVIDER_SITE_OTHER): Payer: BC Managed Care – PPO | Admitting: Physician Assistant

## 2021-06-02 VITALS — BP 127/87 | HR 87 | Temp 98.0°F | Ht 67.0 in | Wt 118.0 lb

## 2021-06-02 DIAGNOSIS — L509 Urticaria, unspecified: Secondary | ICD-10-CM

## 2021-06-02 DIAGNOSIS — H9313 Tinnitus, bilateral: Secondary | ICD-10-CM

## 2021-06-02 DIAGNOSIS — Z872 Personal history of diseases of the skin and subcutaneous tissue: Secondary | ICD-10-CM | POA: Diagnosis not present

## 2021-06-02 DIAGNOSIS — R030 Elevated blood-pressure reading, without diagnosis of hypertension: Secondary | ICD-10-CM

## 2021-06-02 NOTE — Progress Notes (Signed)
Subjective:    Patient ID: Colleen Hendrix, female    DOB: 10/11/1980, 41 y.o.   MRN: [id number]  Chief Complaint  Patient presents with   Establish Care   Hypertension    HPI 41 y.o. patient presents today for new patient establishment with me.  Patient has not had PCP in a few years.  Current Care Team: Dr. Zelphia Cairo with [facility] Allergists and dermatologist - hives of unknown origin [facility] - hives, acne, rosacea   Acute Concerns: 138/95 a few weeks ago Morning BP is normal or low, I.e. 90/77; usually diastolic goes up in afternoons   Life insurance health exam a few weeks ago- labs were normal  Denies any stress in life aside from work, but says that is normal for her.   Walks daily. No chest pain. No SOB. No depression or panic attacks.   Tinnitus x 6 months both ears. Occ headaches. No dizziness. Occ vertigo. Some difficulty hearing.   She has had 4 COVID-19 vaccines.    Past Medical History:  Diagnosis Date   Allergy    Anxiety    Lactose intolerance     Past Surgical History:  Procedure Laterality Date   LASIK Bilateral    SHOULDER SURGERY Right     Family History  Problem Relation Age of Onset   Breast cancer Mother    Hypertension Mother    Hypertension Father    Diabetes Maternal Grandmother    Heart disease Maternal Grandfather    Hypertension Maternal Grandfather    Colon polyps Maternal Grandfather        had a resection   Lung cancer Paternal Grandfather    Stroke Paternal Grandfather    Melanoma Paternal Grandfather    Colon cancer Neg Hx    Esophageal cancer Neg Hx    Rectal cancer Neg Hx    Stomach cancer Neg Hx     Social History   Tobacco Use   Smoking status: Never   Smokeless tobacco: Never  Substance Use Topics   Alcohol use: Yes    Alcohol/week: 0.0 standard drinks    Comment: once a week-wine   Drug use: No     Allergies  Allergen Reactions   Lactose Intolerance (Gi)      Review of Systems NEGATIVE UNLESS OTHERWISE INDICATED IN HPI      Objective:     BP 127/87    Pulse 87    Temp 98 F (36.7 C)    Ht 5\' 7"  (1.702 m)    Wt 118 lb (53.5 kg)    SpO2 98%    BMI 18.48 kg/m   Wt Readings from Last 3 Encounters:  06/02/21 118 lb (53.5 kg)  10/14/14 133 lb (60.3 kg)  10/10/13 133 lb (60.3 kg)    BP Readings from Last 3 Encounters:  06/02/21 127/87  10/14/14 124/84  10/10/13 112/77     Physical Exam Vitals and nursing note reviewed.  Constitutional:      Appearance: Normal appearance. She is normal weight. She is not toxic-appearing.  HENT:     Head: Normocephalic and atraumatic.     Right Ear: External ear normal.     Left Ear: External ear normal.     Nose: Nose normal.     Mouth/Throat:     Mouth: Mucous membranes are moist.  Eyes:     Extraocular Movements: Extraocular movements intact.     Conjunctiva/sclera: Conjunctivae normal.     Pupils:  Pupils are equal, round, and reactive to light.  Cardiovascular:     Rate and Rhythm: Normal rate and regular rhythm.     Pulses: Normal pulses.     Heart sounds: Normal heart sounds.  Pulmonary:     Effort: Pulmonary effort is normal.     Breath sounds: Normal breath sounds.  Abdominal:     General: Abdomen is flat. Bowel sounds are normal.     Palpations: Abdomen is soft.  Musculoskeletal:        General: Normal range of motion.     Cervical back: Normal range of motion and neck supple.  Skin:    General: Skin is warm and dry.  Neurological:     General: No focal deficit present.     Mental Status: She is alert and oriented to person, place, and time.  Psychiatric:        Mood and Affect: Mood normal.        Behavior: Behavior normal.        Thought Content: Thought content normal.        Judgment: Judgment normal.       Assessment & Plan:   Problem List Items Addressed This Visit   None Visit Diagnoses     Blood pressure increase diastolic    -  Primary   History of  rosacea       Urticaria       Tinnitus of both ears           Plan: -New pt establishment, doing well overall -Reassured that her blood pressure looks normal, but she will continue to track at home and bring log to next appt -She will cont to work with allergist and dermatology teams -considering ENT referral for tinnitus -Hoping with time that symptoms will resolve. Encouraged her to keep up good work with healthy lifestyle and to f/up prn.   Wayde Gopaul M Rithvik Orcutt, PA-C

## 2021-06-02 NOTE — Patient Instructions (Addendum)
Good to meet you today!  Keep up the good work with healthy lifestyle. Track BP readings three times at random per week, bring log to next appt.  Call sooner if any concerns. We'll just monitor symptoms for now.   Let me know if you want to see ENT.

## 2021-06-29 DIAGNOSIS — D225 Melanocytic nevi of trunk: Secondary | ICD-10-CM | POA: Diagnosis not present

## 2021-06-29 DIAGNOSIS — L814 Other melanin hyperpigmentation: Secondary | ICD-10-CM | POA: Diagnosis not present

## 2021-06-29 DIAGNOSIS — L508 Other urticaria: Secondary | ICD-10-CM | POA: Diagnosis not present

## 2021-06-29 DIAGNOSIS — L821 Other seborrheic keratosis: Secondary | ICD-10-CM | POA: Diagnosis not present

## 2021-07-13 DIAGNOSIS — H1045 Other chronic allergic conjunctivitis: Secondary | ICD-10-CM | POA: Diagnosis not present

## 2021-07-13 DIAGNOSIS — T783XXD Angioneurotic edema, subsequent encounter: Secondary | ICD-10-CM | POA: Diagnosis not present

## 2021-07-13 DIAGNOSIS — L501 Idiopathic urticaria: Secondary | ICD-10-CM | POA: Diagnosis not present

## 2021-07-13 DIAGNOSIS — J300 Vasomotor rhinitis: Secondary | ICD-10-CM | POA: Diagnosis not present

## 2021-07-15 DIAGNOSIS — Z309 Encounter for contraceptive management, unspecified: Secondary | ICD-10-CM | POA: Diagnosis not present

## 2021-08-03 ENCOUNTER — Other Ambulatory Visit: Payer: Self-pay | Admitting: Obstetrics and Gynecology

## 2021-08-03 DIAGNOSIS — Z1231 Encounter for screening mammogram for malignant neoplasm of breast: Secondary | ICD-10-CM

## 2021-09-07 ENCOUNTER — Ambulatory Visit: Payer: BC Managed Care – PPO

## 2021-09-07 ENCOUNTER — Ambulatory Visit
Admission: RE | Admit: 2021-09-07 | Discharge: 2021-09-07 | Disposition: A | Discharge status: Home / Self Care | Payer: BC Managed Care – PPO | Source: Ambulatory Visit | Attending: Obstetrics and Gynecology | Admitting: Obstetrics and Gynecology

## 2021-09-07 DIAGNOSIS — Z1231 Encounter for screening mammogram for malignant neoplasm of breast: Secondary | ICD-10-CM | POA: Diagnosis not present

## 2021-10-05 ENCOUNTER — Ambulatory Visit: Payer: BC Managed Care – PPO | Admitting: Physician Assistant

## 2021-10-13 ENCOUNTER — Encounter: Payer: Self-pay | Admitting: Physician Assistant

## 2021-10-13 ENCOUNTER — Ambulatory Visit (INDEPENDENT_AMBULATORY_CARE_PROVIDER_SITE_OTHER): Payer: BC Managed Care – PPO | Admitting: Physician Assistant

## 2021-10-13 VITALS — BP 110/80 | HR 91 | Temp 98.7°F | Resp 16 | Ht 67.0 in | Wt 121.2 lb

## 2021-10-13 DIAGNOSIS — L509 Urticaria, unspecified: Secondary | ICD-10-CM

## 2021-10-13 DIAGNOSIS — F419 Anxiety disorder, unspecified: Secondary | ICD-10-CM

## 2021-10-13 DIAGNOSIS — R030 Elevated blood-pressure reading, without diagnosis of hypertension: Secondary | ICD-10-CM

## 2021-10-13 MED ORDER — BUSPIRONE HCL 5 MG PO TABS: 5.0000 mg | ORAL_TABLET | Freq: Two times a day (BID) | ORAL | 0 refills | Status: DC

## 2021-10-13 NOTE — Progress Notes (Signed)
Subjective:    Patient ID: Colleen Hendrix, female    DOB: 11/24/1980, 41 y.o.   MRN: [id number]  Chief Complaint  Patient presents with   blood pressure increase diastolic    Stopped birth control and started drinking beet juice to help lower BP    Urticaria   Anxiety    Has worsened over the last few months, finally wanting to address with PCP Does have a script for Hydroxyzine for hives and does take this sometimes for anxiety  States she is only getting about 4-5 hours of sleep per night     Patient is in today for f/up on BP, hives, anxiety.   Blood pressure readings at home -Diastolic 70s-80s, usually higher in the afternoons -Stopped birth control altogether in April 2023, talked to GYN about it; feels good, just irregular cycles right now  -Taking beet juice  Anxiety - -Never treated previously, thinking she has symptoms, saw similar sx's in dad -Trouble sleeping; ringing in ears; clenching in jaw -Works constantly -Recently worse due to loss of boyfriend's mother with dementia - need to travel to [place] this weekend for funeral; BF to stay and help with his father for some time; pt says her family has hx of dementia as well -Worries often  Hives -  Has hydroxyzine to take prn & says this works, but also doesn't want to become used to it if she takes it for anxiety and then really needs it for acute hives   Past Medical History:  Diagnosis Date   Allergy    Anxiety    Lactose intolerance     Past Surgical History:  Procedure Laterality Date   LASIK Bilateral    SHOULDER SURGERY Right     Family History  Problem Relation Age of Onset   Breast cancer Mother    Hypertension Mother    Hypertension Father    Diabetes Maternal Grandmother    Heart disease Maternal Grandfather    Hypertension Maternal Grandfather    Colon polyps Maternal Grandfather        had a resection   Lung cancer Paternal Grandfather    Stroke Paternal Grandfather    Melanoma Paternal  Grandfather    Colon cancer Neg Hx    Esophageal cancer Neg Hx    Rectal cancer Neg Hx    Stomach cancer Neg Hx     Social History   Tobacco Use   Smoking status: Never   Smokeless tobacco: Never  Substance Use Topics   Alcohol use: Yes    Alcohol/week: 0.0 standard drinks of alcohol    Comment: once a week-wine   Drug use: No     Allergies  Allergen Reactions   Lactose Intolerance (Gi)     Review of Systems NEGATIVE UNLESS OTHERWISE INDICATED I N HPI      Objective:     BP 110/80   Pulse 91   Temp 98.7 F (37.1 C) (Temporal)   Resp 16   Ht 5\' 7"  (1.702 m)   Wt 121 lb 3.2 oz (55 kg)   SpO2 99%   BMI 18.98 kg/m   Wt Readings from Last 3 Encounters:  10/13/21 121 lb 3.2 oz (55 kg)  06/02/21 118 lb (53.5 kg)  10/14/14 133 lb (60.3 kg)    BP Readings from Last 3 Encounters:  10/13/21 110/80  06/02/21 127/87  10/14/14 124/84     Physical Exam Constitutional:      Appearance: Normal appearance.  Cardiovascular:  Rate and Rhythm: Normal rate and regular rhythm.  Pulmonary:     Effort: Pulmonary effort is normal.  Neurological:     General: No focal deficit present.     Mental Status: She is alert.  Psychiatric:        Mood and Affect: Mood normal.        Behavior: Behavior normal.        Assessment & Plan:   Problem List Items Addressed This Visit   None Visit Diagnoses     Anxiety    -  Primary   Relevant Medications   hydrOXYzine (ATARAX) 10 MG tablet   busPIRone (BUSPAR) 5 MG tablet   Blood pressure increase diastolic       Urticaria            Meds ordered this encounter  Medications   busPIRone (BUSPAR) 5 MG tablet    Sig: Take 1 tablet (5 mg total) by mouth 2 (two) times daily.    Dispense:  60 tablet    Refill:  0    Order Specific Question:   Supervising Provider    Answer:   Durene Cal, STEPHEN O [[id number]]   1. Anxiety    10/13/2021    1:48 PM  GAD 7 : Generalized Anxiety Score  Nervous, Anxious, on Edge 3   Control/stop worrying 2  Worry too much - different things 3  Trouble relaxing 3  Restless 3  Easily annoyed or irritable 2  Afraid - awful might happen 1  Total GAD 7 Score 17  Anxiety Difficulty Somewhat difficult   Severe Counseling advised Plan to start on buspar 5 mg BID; may titrate up as needed week by week Pt aware of risks vs benefits and possible adverse reactions  Pt to mychart with updates and concerns    2. Blood pressure increase diastolic BP stable Stopping OCP helped; beet juice daily helps Stress and anxiety plays a role Cont to monitor   3. Urticaria Stable  Hydroxyzine 10 mg prn    Return in about 3 months (around 01/13/2022) for medication recheck .  This note was prepared with assistance of Conservation officer, historic buildings. Occasional wrong-word or sound-a-like substitutions may have occurred due to the inherent limitations of voice recognition software.   Felicia Bloomquist M Anatole Apollo, PA-C

## 2021-11-14 ENCOUNTER — Other Ambulatory Visit: Payer: Self-pay | Admitting: Physician Assistant

## 2021-12-20 DIAGNOSIS — Z681 Body mass index (BMI) 19 or less, adult: Secondary | ICD-10-CM | POA: Diagnosis not present

## 2021-12-20 DIAGNOSIS — N760 Acute vaginitis: Secondary | ICD-10-CM | POA: Diagnosis not present

## 2021-12-20 DIAGNOSIS — Z01419 Encounter for gynecological examination (general) (routine) without abnormal findings: Secondary | ICD-10-CM | POA: Diagnosis not present

## 2021-12-21 DIAGNOSIS — B070 Plantar wart: Secondary | ICD-10-CM | POA: Diagnosis not present

## 2022-01-03 ENCOUNTER — Encounter: Payer: Self-pay | Admitting: *Deleted

## 2022-01-20 ENCOUNTER — Ambulatory Visit (INDEPENDENT_AMBULATORY_CARE_PROVIDER_SITE_OTHER): Payer: BC Managed Care – PPO | Admitting: Physician Assistant

## 2022-01-20 ENCOUNTER — Encounter: Payer: Self-pay | Admitting: Physician Assistant

## 2022-01-20 VITALS — BP 113/78 | HR 76 | Temp 98.0°F | Ht 67.0 in | Wt 120.2 lb

## 2022-01-20 DIAGNOSIS — F419 Anxiety disorder, unspecified: Secondary | ICD-10-CM | POA: Diagnosis not present

## 2022-01-20 DIAGNOSIS — Z23 Encounter for immunization: Secondary | ICD-10-CM | POA: Diagnosis not present

## 2022-01-20 NOTE — Assessment & Plan Note (Signed)
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

## 2022-01-20 NOTE — Progress Notes (Signed)
Subjective:    Patient ID: Colleen Hendrix, female    DOB: 11-19-1980, 41 y.o.   MRN: [id number]  Chief Complaint  Patient presents with   Follow-up    Pt in for 3 mon f/u and med check;     HPI Patient is in today for 3 month f/up.  Still has high functioning anxiety. 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 Boss [employer]) very over-bearing, no boundaries, texting while on vacation, etc. Some mild panic when having to interact with her for extended periods.   Buspar 5 mg BID helps with racing thoughts. No side effects. Takes hydroxyzine 10 mg as needed to help with sleep.   Feels good being off birth control. Still hasn't had a menstrual cycle in 3 months, but following with GYN. Much less recurrence of hives.   Past Medical History:  Diagnosis Date   Allergy    Anxiety    Lactose intolerance     Past Surgical History:  Procedure Laterality Date   LASIK Bilateral    SHOULDER SURGERY Right     Family History  Problem Relation Age of Onset   Breast cancer Mother    Hypertension Mother    Hypertension Father    Diabetes Maternal Grandmother    Heart disease Maternal Grandfather    Hypertension Maternal Grandfather    Colon polyps Maternal Grandfather        had a resection   Lung cancer Paternal Grandfather    Stroke Paternal Grandfather    Melanoma Paternal Grandfather    Colon cancer Neg Hx    Esophageal cancer Neg Hx    Rectal cancer Neg Hx    Stomach cancer Neg Hx     Social History   Tobacco Use   Smoking status: Never   Smokeless tobacco: Never  Substance Use Topics   Alcohol use: Yes    Alcohol/week: 0.0 standard drinks of alcohol    Comment: once a week-wine   Drug use: No     Allergies  Allergen Reactions   Lactose Intolerance (Gi)     Review of Systems NEGATIVE UNLESS OTHERWISE INDICATED IN HPI      Objective:     BP 113/78 (BP Location: Left Arm, Patient Position: Sitting)   Pulse 76   Temp 98 F (36.7 C) (Temporal)   Ht 5\' 7"  (1.702 m)   Wt 120 lb  3.2 oz (54.5 kg)   LMP 08/23/2021 (Exact Date)   SpO2 100%   BMI 18.83 kg/m   Wt Readings from Last 3 Encounters:  01/20/22 120 lb 3.2 oz (54.5 kg)  10/13/21 121 lb 3.2 oz (55 kg)  06/02/21 118 lb (53.5 kg)    BP Readings from Last 3 Encounters:  01/20/22 113/78  10/13/21 110/80  06/02/21 127/87     Physical Exam Constitutional:      Appearance: Normal appearance.  Cardiovascular:     Rate and Rhythm: Normal rate and regular rhythm.     Pulses: Normal pulses.     Heart sounds: Normal heart sounds.  Pulmonary:     Effort: Pulmonary effort is normal.     Breath sounds: Normal breath sounds.  Skin:    Findings: No rash.  Neurological:     General: No focal deficit present.     Mental Status: She is alert and oriented to person, place, and time.  Psychiatric:        Mood and Affect: Mood normal.        Behavior: Behavior normal.  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     Nihaal Friesen M Najiyah Paris, PA-C

## 2022-02-19 ENCOUNTER — Other Ambulatory Visit: Payer: Self-pay | Admitting: Physician Assistant

## 2022-03-26 ENCOUNTER — Other Ambulatory Visit: Payer: Self-pay | Admitting: Physician Assistant

## 2022-05-12 ENCOUNTER — Other Ambulatory Visit: Payer: Self-pay | Admitting: Physician Assistant

## 2022-06-02 DIAGNOSIS — N3001 Acute cystitis with hematuria: Secondary | ICD-10-CM | POA: Diagnosis not present

## 2022-06-14 DIAGNOSIS — N912 Amenorrhea, unspecified: Secondary | ICD-10-CM | POA: Diagnosis not present

## 2022-06-14 DIAGNOSIS — N760 Acute vaginitis: Secondary | ICD-10-CM | POA: Diagnosis not present

## 2022-06-14 DIAGNOSIS — Z8349 Family history of other endocrine, nutritional and metabolic diseases: Secondary | ICD-10-CM | POA: Diagnosis not present

## 2022-06-23 ENCOUNTER — Other Ambulatory Visit: Payer: Self-pay | Admitting: Physician Assistant

## 2022-06-30 DIAGNOSIS — L814 Other melanin hyperpigmentation: Secondary | ICD-10-CM | POA: Diagnosis not present

## 2022-06-30 DIAGNOSIS — D225 Melanocytic nevi of trunk: Secondary | ICD-10-CM | POA: Diagnosis not present

## 2022-06-30 DIAGNOSIS — L821 Other seborrheic keratosis: Secondary | ICD-10-CM | POA: Diagnosis not present

## 2022-06-30 DIAGNOSIS — L718 Other rosacea: Secondary | ICD-10-CM | POA: Diagnosis not present

## 2022-07-01 DIAGNOSIS — Z13220 Encounter for screening for lipoid disorders: Secondary | ICD-10-CM | POA: Diagnosis not present

## 2022-07-01 DIAGNOSIS — Z1329 Encounter for screening for other suspected endocrine disorder: Secondary | ICD-10-CM | POA: Diagnosis not present

## 2022-07-01 DIAGNOSIS — Z131 Encounter for screening for diabetes mellitus: Secondary | ICD-10-CM | POA: Diagnosis not present

## 2022-07-01 DIAGNOSIS — L719 Rosacea, unspecified: Secondary | ICD-10-CM | POA: Diagnosis not present

## 2022-07-01 DIAGNOSIS — J302 Other seasonal allergic rhinitis: Secondary | ICD-10-CM | POA: Diagnosis not present

## 2022-07-01 DIAGNOSIS — E559 Vitamin D deficiency, unspecified: Secondary | ICD-10-CM | POA: Diagnosis not present

## 2022-07-01 DIAGNOSIS — L308 Other specified dermatitis: Secondary | ICD-10-CM | POA: Diagnosis not present

## 2022-07-01 DIAGNOSIS — R790 Abnormal level of blood mineral: Secondary | ICD-10-CM | POA: Diagnosis not present

## 2022-07-01 DIAGNOSIS — E538 Deficiency of other specified B group vitamins: Secondary | ICD-10-CM | POA: Diagnosis not present

## 2022-07-01 DIAGNOSIS — N951 Menopausal and female climacteric states: Secondary | ICD-10-CM | POA: Diagnosis not present

## 2022-07-21 ENCOUNTER — Ambulatory Visit (INDEPENDENT_AMBULATORY_CARE_PROVIDER_SITE_OTHER): Payer: BC Managed Care – PPO | Admitting: Physician Assistant

## 2022-07-21 VITALS — BP 120/84 | HR 76 | Temp 97.5°F | Ht 67.0 in | Wt 124.6 lb

## 2022-07-21 DIAGNOSIS — E28319 Asymptomatic premature menopause: Secondary | ICD-10-CM

## 2022-07-21 DIAGNOSIS — Z23 Encounter for immunization: Secondary | ICD-10-CM | POA: Diagnosis not present

## 2022-07-21 DIAGNOSIS — F419 Anxiety disorder, unspecified: Secondary | ICD-10-CM | POA: Diagnosis not present

## 2022-07-21 DIAGNOSIS — Z0000 Encounter for general adult medical examination without abnormal findings: Secondary | ICD-10-CM | POA: Insufficient documentation

## 2022-07-21 DIAGNOSIS — T783XXA Angioneurotic edema, initial encounter: Secondary | ICD-10-CM | POA: Insufficient documentation

## 2022-07-21 MED ORDER — BUSPIRONE HCL 5 MG PO TABS: 5.0000 mg | ORAL_TABLET | Freq: Two times a day (BID) | ORAL | 3 refills | Status: AC

## 2022-07-21 NOTE — Assessment & Plan Note (Signed)
Stable on Buspar 5 mg BID Holding boundaries with work, boss, and friends better

## 2022-07-21 NOTE — Progress Notes (Signed)
Subjective:    Patient ID: Jazzmyne Kellough, female    DOB: April 25, 1980, 42 y.o.   MRN: [id number]  Chief Complaint  Patient presents with   Annual Exam    Pt in for annual CPE but not fasting for labs; will schedule lab only appt to come back for fasting labs; pt has no concerns to discuss; pt currently taking anxiety meds; pt thinks meds are working well;     HPI Patient is in today for annual exam.  Health maintenance: Lifestyle/ exercise: Enjoys walking and staying active  Nutrition: Well-balaned Mental health: Doing well, recently back from vacation, stable with Buspar 5 mg BID Sleep: Some hot flashes and night sweats since going through menopause, but manageable; doing better setting boundaries with her boss; no more panic attacks  Substance use: None  Sexual activity: Monogamous with boyfriend  Immunizations: Tdap updated today  Pap: UTD with GYN  Mammogram: UTD, doing well, normal  Post-menopausal - confirmed with GYN through labs; has been well over a year w/o a menstrual cycle; taking Vit D and Calcium supplement  Skin: no concerns, doing well, no more hives; no acne issues; derm f/up    Past Medical History:  Diagnosis Date   Allergy    Anxiety    Lactose intolerance     Past Surgical History:  Procedure Laterality Date   LASIK Bilateral    SHOULDER SURGERY Right     Family History  Problem Relation Age of Onset   Breast cancer Mother    Hypertension Mother    Hypertension Father    Diabetes Maternal Grandmother    Heart disease Maternal Grandfather    Hypertension Maternal Grandfather    Colon polyps Maternal Grandfather        had a resection   Lung cancer Paternal Grandfather    Stroke Paternal Grandfather    Melanoma Paternal Grandfather    Colon cancer Neg Hx    Esophageal cancer Neg Hx    Rectal cancer Neg Hx    Stomach cancer Neg Hx     Social History   Tobacco Use   Smoking status: Never   Smokeless tobacco: Never  Substance Use Topics    Alcohol use: Yes    Alcohol/week: 0.0 standard drinks of alcohol    Comment: once a week-wine   Drug use: No     Allergies  Allergen Reactions   Lactose Intolerance (Gi)     Review of Systems NEGATIVE UNLESS OTHERWISE INDICATED IN HPI      Objective:     BP 120/84 (BP Location: Left Arm)   Pulse 76   Temp (!) 97.5 F (36.4 C) (Temporal)   Ht 5\' 7"  (1.702 m)   Wt 124 lb 9.6 oz (56.5 kg)   SpO2 99%   BMI 19.52 kg/m   Wt Readings from Last 3 Encounters:  07/21/22 124 lb 9.6 oz (56.5 kg)  01/20/22 120 lb 3.2 oz (54.5 kg)  10/13/21 121 lb 3.2 oz (55 kg)    BP Readings from Last 3 Encounters:  07/21/22 120/84  01/20/22 113/78  10/13/21 110/80     Physical Exam Vitals and nursing note reviewed.  Constitutional:      Appearance: Normal appearance. She is normal weight. She is not toxic-appearing.  HENT:     Head: Normocephalic and atraumatic.     Right Ear: Tympanic membrane, ear canal and external ear normal.     Left Ear: Tympanic membrane, ear canal and external ear normal.  Nose: Nose normal.     Mouth/Throat:     Mouth: Mucous membranes are moist.  Eyes:     Extraocular Movements: Extraocular movements intact.     Conjunctiva/sclera: Conjunctivae normal.     Pupils: Pupils are equal, round, and reactive to light.  Cardiovascular:     Rate and Rhythm: Normal rate and regular rhythm.     Pulses: Normal pulses.     Heart sounds: Normal heart sounds.  Pulmonary:     Effort: Pulmonary effort is normal.     Breath sounds: Normal breath sounds.  Musculoskeletal:        General: Normal range of motion.     Cervical back: Normal range of motion and neck supple.     Right lower leg: No edema.     Left lower leg: No edema.  Skin:    General: Skin is warm and dry.  Neurological:     General: No focal deficit present.     Mental Status: She is alert and oriented to person, place, and time.  Psychiatric:        Mood and Affect: Mood normal.         Behavior: Behavior normal.        Thought Content: Thought content normal.        Judgment: Judgment normal.        Assessment & Plan:  Encounter for annual physical exam -     CBC with Differential/Platelet; Future -     Comprehensive metabolic panel; Future -     Lipid panel; Future -     TSH; Future  Anxiety Assessment & Plan: Stable on Buspar 5 mg BID Holding boundaries with work, boss, and friends better    Early menopause occurring in patient age younger than 26 years Assessment & Plan: Confirmed menopause age 40; pt following with GYN; will discuss early DEXA, possibly age 74. No significant symptoms, not considering HRT at this time. She is taking Ca & Vit D daily.    Need for prophylactic vaccination with combined diphtheria-tetanus-pertussis (DTP) vaccine -     Tdap vaccine greater than or equal to 7yo IM  Other orders -     busPIRone HCl; Take 1 tablet (5 mg total) by mouth 2 (two) times daily.  Dispense: 180 tablet; Refill: 3   Age-appropriate screening and counseling performed today. Will check labs and call with results. Preventive measures discussed and printed in AVS for patient.   Patient Counseling: [x]   Nutrition: Stressed importance of moderation in sodium/caffeine intake, saturated fat and cholesterol, caloric balance, sufficient intake of fresh fruits, vegetables, and fiber.  [x]   Stressed the importance of regular exercise.   []   Substance Abuse: Discussed cessation/primary prevention of tobacco, alcohol, or other drug use; driving or other dangerous activities under the influence; availability of treatment for abuse.   []   Injury prevention: Discussed safety belts, safety helmets, smoke detector, smoking near bedding or upholstery.   []   Sexuality: Discussed sexually transmitted diseases, partner selection, use of condoms, avoidance of unintended pregnancy  and contraceptive alternatives.   [x]   Dental health: Discussed importance of regular tooth  brushing, flossing, and dental visits.  [x]   Health maintenance and immunizations reviewed. Please refer to Health maintenance section.        Return in about 1 year (around 07/21/2023) for CPE, labs .    Vadie Principato M Jenner Rosier, PA-C

## 2022-07-21 NOTE — Assessment & Plan Note (Signed)
Confirmed menopause age 42; pt following with GYN; will discuss early DEXA, possibly age 39. No significant symptoms, not considering HRT at this time. She is taking Ca & Vit D daily.

## 2022-07-25 ENCOUNTER — Other Ambulatory Visit (INDEPENDENT_AMBULATORY_CARE_PROVIDER_SITE_OTHER): Payer: BC Managed Care – PPO

## 2022-07-25 DIAGNOSIS — Z0000 Encounter for general adult medical examination without abnormal findings: Secondary | ICD-10-CM | POA: Diagnosis not present

## 2022-07-25 LAB — CBC WITH DIFFERENTIAL/PLATELET
Basophils Absolute: 0.1 10*3/uL (ref 0.0–0.1)
Basophils Relative: 2.8 % (ref 0.0–3.0)
Eosinophils Absolute: 0.2 10*3/uL (ref 0.0–0.7)
Eosinophils Relative: 7.1 % — ABNORMAL HIGH (ref 0.0–5.0)
HCT: 38.4 % (ref 36.0–46.0)
Hemoglobin: 13.3 g/dL (ref 12.0–15.0)
Lymphocytes Relative: 46.2 % — ABNORMAL HIGH (ref 12.0–46.0)
Lymphs Abs: 1.5 10*3/uL (ref 0.7–4.0)
MCHC: 34.6 g/dL (ref 30.0–36.0)
MCV: 91.7 fl (ref 78.0–100.0)
Monocytes Absolute: 0.3 10*3/uL (ref 0.1–1.0)
Monocytes Relative: 8.5 % (ref 3.0–12.0)
Neutro Abs: 1.1 10*3/uL — ABNORMAL LOW (ref 1.4–7.7)
Neutrophils Relative %: 35.4 % — ABNORMAL LOW (ref 43.0–77.0)
Platelets: 180 10*3/uL (ref 150.0–400.0)
RBC: 4.19 Mil/uL (ref 3.87–5.11)
RDW: 13 % (ref 11.5–15.5)
WBC: 3.2 10*3/uL — ABNORMAL LOW (ref 4.0–10.5)

## 2022-07-25 LAB — LIPID PANEL
Cholesterol: 177 mg/dL (ref 0–200)
HDL: 70.6 mg/dL (ref >39.00)
LDL Cholesterol: 88 mg/dL (ref 0–99)
NonHDL: 106.82
Total CHOL/HDL Ratio: 3
Triglycerides: 95 mg/dL (ref 0.0–149.0)
VLDL: 19 mg/dL (ref 0.0–40.0)

## 2022-07-25 LAB — COMPREHENSIVE METABOLIC PANEL
ALT: 23 U/L (ref 0–35)
AST: 25 U/L (ref 0–37)
Albumin: 4.5 g/dL (ref 3.5–5.2)
Alkaline Phosphatase: 72 U/L (ref 39–117)
BUN: 14 mg/dL (ref 6–23)
CO2: 28 mEq/L (ref 19–32)
Calcium: 9.4 mg/dL (ref 8.4–10.5)
Chloride: 104 mEq/L (ref 96–112)
Creatinine, Ser: 0.78 mg/dL (ref 0.40–1.20)
GFR: 93.3 mL/min (ref >60.00)
Glucose, Bld: 84 mg/dL (ref 70–99)
Potassium: 3.9 mEq/L (ref 3.5–5.1)
Sodium: 140 mEq/L (ref 135–145)
Total Bilirubin: 0.7 mg/dL (ref 0.2–1.2)
Total Protein: 7.1 g/dL (ref 6.0–8.3)

## 2022-07-25 LAB — TSH: TSH: 3.08 u[IU]/mL (ref 0.35–5.50)

## 2022-07-28 ENCOUNTER — Telehealth: Payer: Self-pay | Admitting: Physician Assistant

## 2022-07-28 ENCOUNTER — Other Ambulatory Visit: Payer: Self-pay

## 2022-07-28 DIAGNOSIS — D72819 Decreased white blood cell count, unspecified: Secondary | ICD-10-CM

## 2022-07-28 NOTE — Telephone Encounter (Signed)
Pt would like a call back with lab results 

## 2022-07-28 NOTE — Telephone Encounter (Signed)
See lab notes

## 2022-08-08 ENCOUNTER — Other Ambulatory Visit: Payer: Self-pay | Admitting: Obstetrics and Gynecology

## 2022-08-08 DIAGNOSIS — Z1231 Encounter for screening mammogram for malignant neoplasm of breast: Secondary | ICD-10-CM

## 2022-08-11 DIAGNOSIS — R790 Abnormal level of blood mineral: Secondary | ICD-10-CM | POA: Diagnosis not present

## 2022-08-11 DIAGNOSIS — J302 Other seasonal allergic rhinitis: Secondary | ICD-10-CM | POA: Diagnosis not present

## 2022-08-11 DIAGNOSIS — N951 Menopausal and female climacteric states: Secondary | ICD-10-CM | POA: Diagnosis not present

## 2022-08-11 DIAGNOSIS — Z1329 Encounter for screening for other suspected endocrine disorder: Secondary | ICD-10-CM | POA: Diagnosis not present

## 2022-08-11 IMAGING — MG MM DIGITAL SCREENING BILAT W/ TOMO AND CAD
8 series · 9 of 24 positions shown · non-contrast
Comparison: Previous exam(s).

CLINICAL DATA: Screening.

EXAM:
DIGITAL SCREENING BILATERAL MAMMOGRAM WITH TOMOSYNTHESIS AND CAD
TECHNIQUE: Bilateral screening digital craniocaudal and mediolateral oblique
mammograms were obtained. Bilateral screening digital breast
tomosynthesis was performed. The images were evaluated with
computer-aided detection.

[L MLO synth-2D]
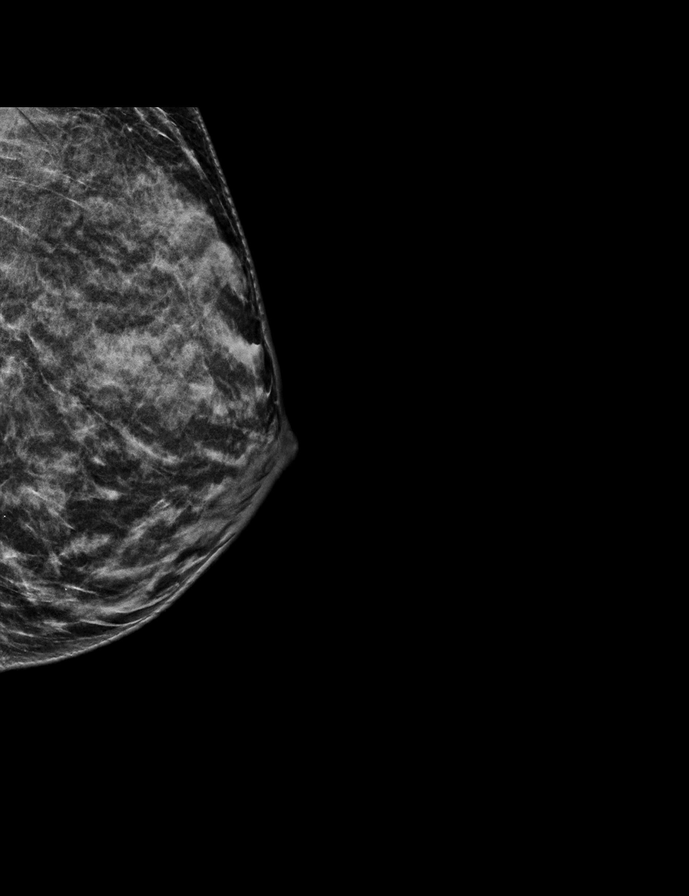

[R MLO synth-2D]
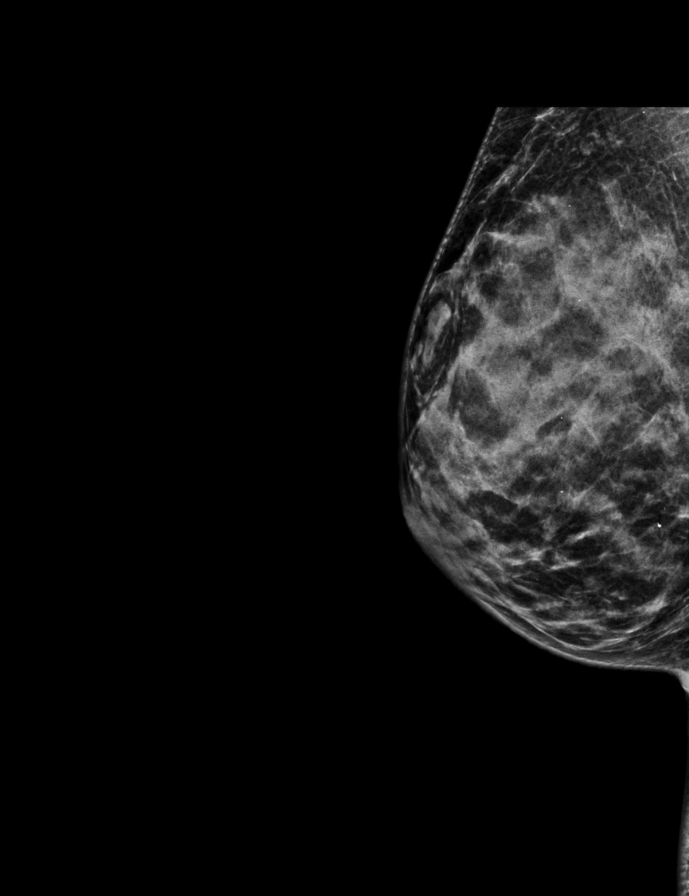

[L CC synth-2D]
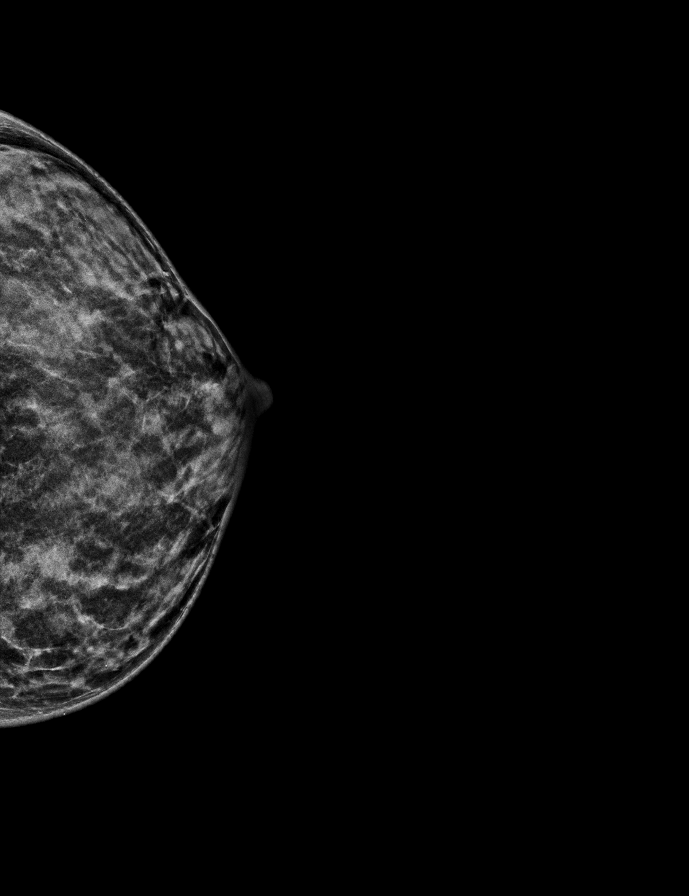

[R CC synth-2D]
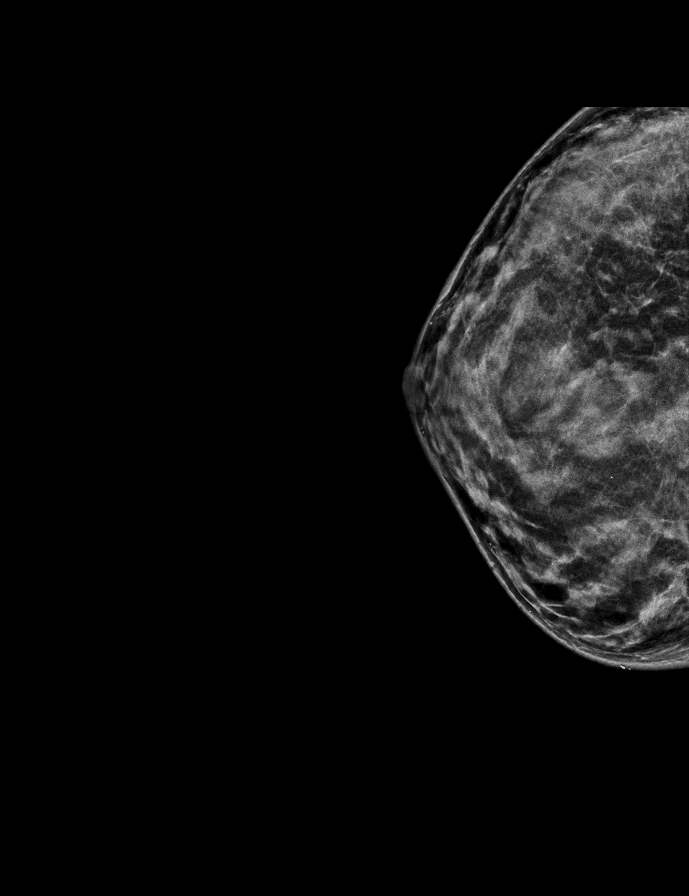

[R CC tomo · 2 of 36 frames shown]
[frame 12/36]
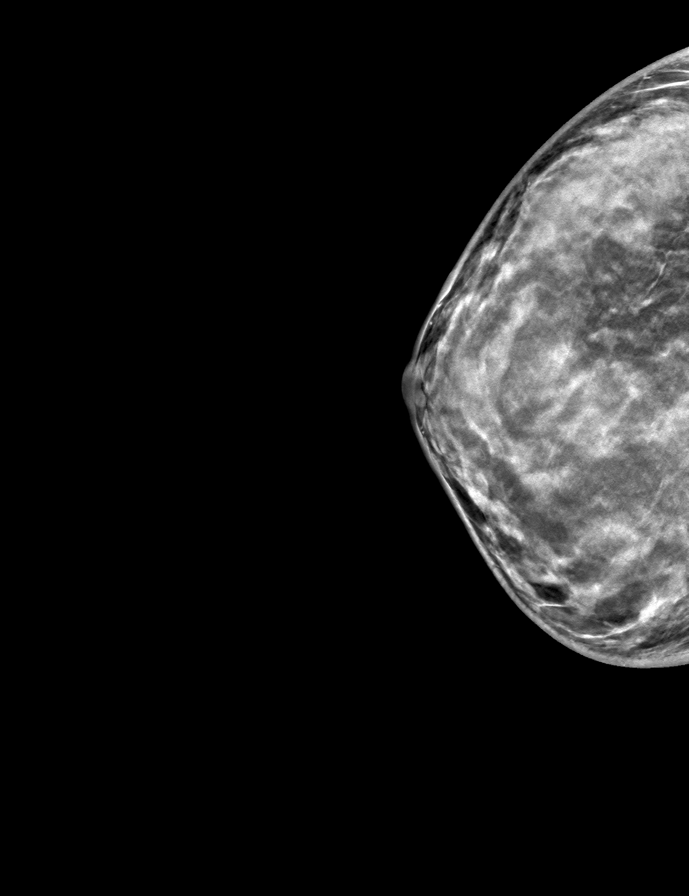
[frame 19/36]
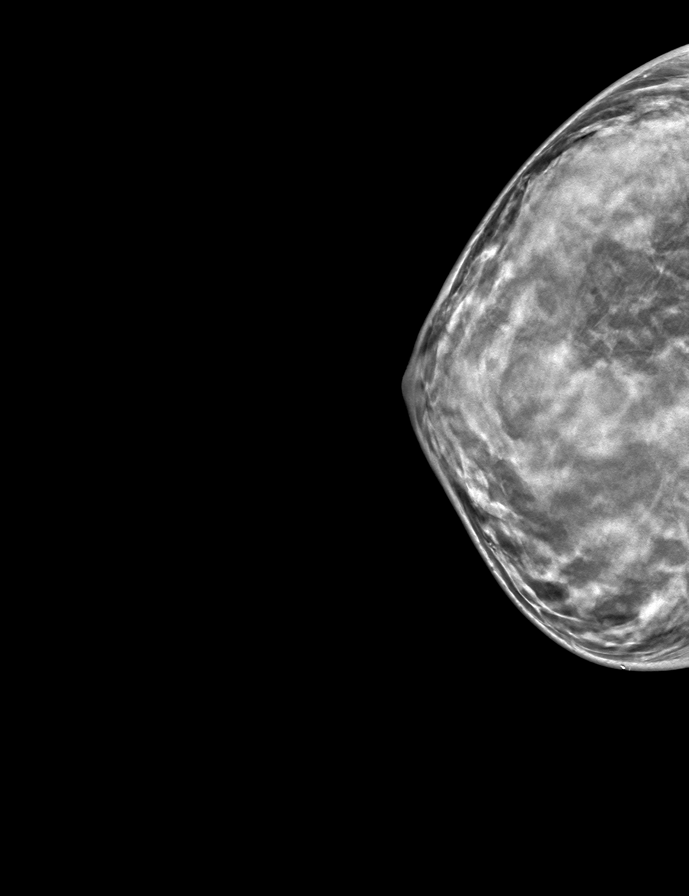

[L CC tomo · tomo slice 17/33.0]
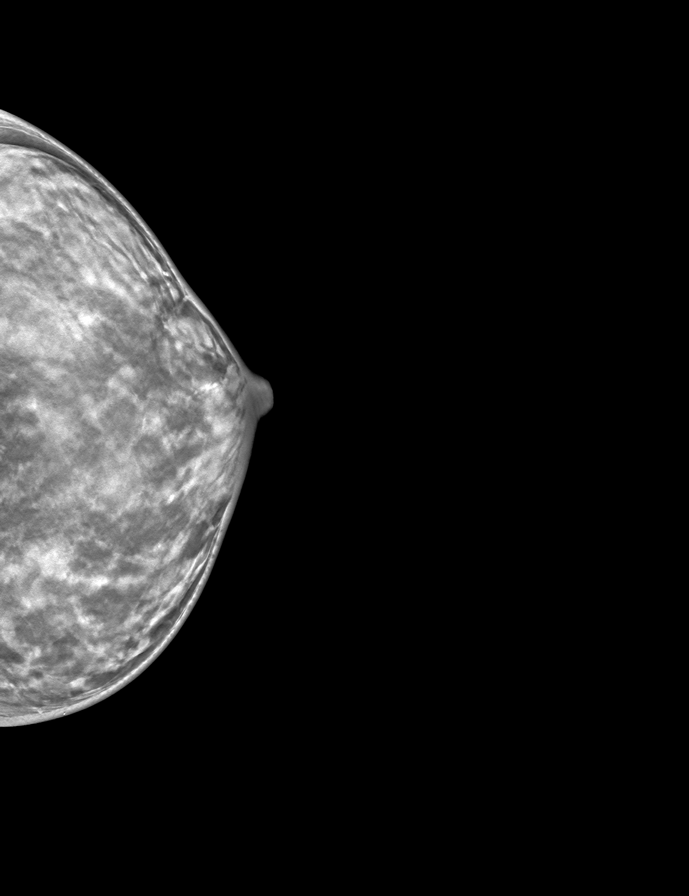

[R MLO tomo · tomo slice 20/39.0]
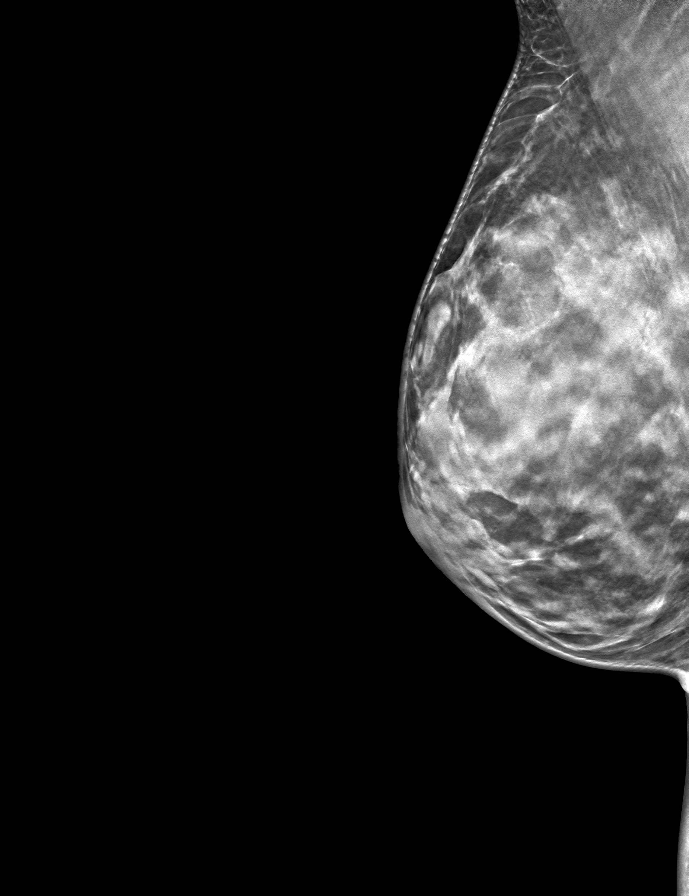

[L MLO tomo · tomo slice 19/36.0]
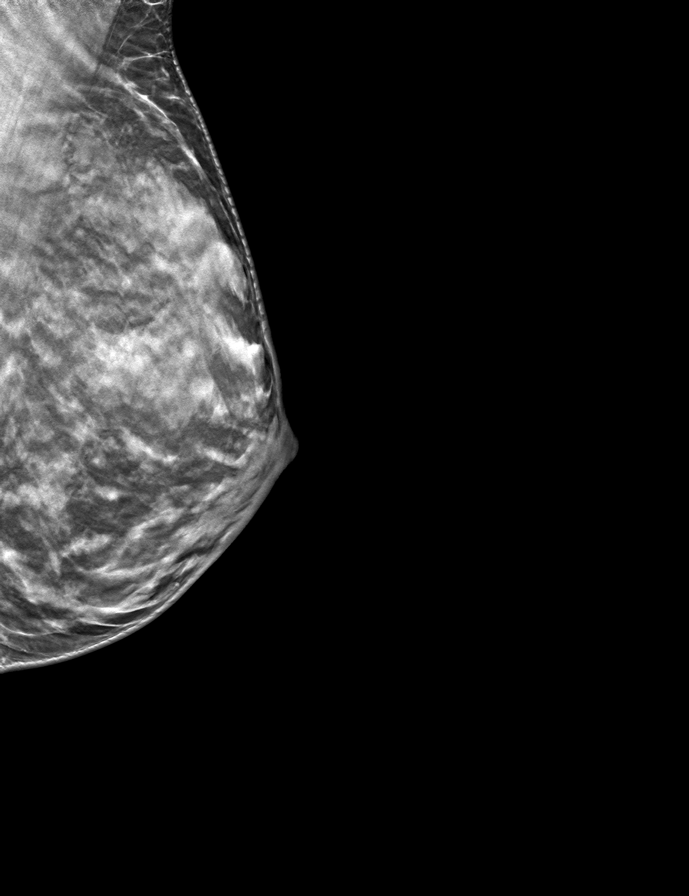

[9 of 24 positions shown; findings below may reference images not displayed]

ACR Breast Density Category d: The breast tissue is extremely dense,
which lowers the sensitivity of mammography
FINDINGS: There are no findings suspicious for malignancy.
IMPRESSION: No mammographic evidence of malignancy. A result letter of this
screening mammogram will be mailed directly to the patient.

RECOMMENDATION:
Screening mammogram in one year. (Code:TA-V-WV9)

BI-RADS CATEGORY  1: Negative.

## 2022-08-22 ENCOUNTER — Other Ambulatory Visit (INDEPENDENT_AMBULATORY_CARE_PROVIDER_SITE_OTHER): Payer: BC Managed Care – PPO

## 2022-08-22 DIAGNOSIS — D72819 Decreased white blood cell count, unspecified: Secondary | ICD-10-CM

## 2022-08-22 LAB — CBC WITH DIFFERENTIAL/PLATELET
Basophils Absolute: 0.1 10*3/uL (ref 0.0–0.1)
Basophils Relative: 2.2 % (ref 0.0–3.0)
Eosinophils Absolute: 0.2 10*3/uL (ref 0.0–0.7)
Eosinophils Relative: 4 % (ref 0.0–5.0)
HCT: 37.6 % (ref 36.0–46.0)
Hemoglobin: 13.1 g/dL (ref 12.0–15.0)
Lymphocytes Relative: 43.2 % (ref 12.0–46.0)
Lymphs Abs: 1.6 10*3/uL (ref 0.7–4.0)
MCHC: 34.8 g/dL (ref 30.0–36.0)
MCV: 92 fl (ref 78.0–100.0)
Monocytes Absolute: 0.3 10*3/uL (ref 0.1–1.0)
Monocytes Relative: 9 % (ref 3.0–12.0)
Neutro Abs: 1.6 10*3/uL (ref 1.4–7.7)
Neutrophils Relative %: 41.6 % — ABNORMAL LOW (ref 43.0–77.0)
Platelets: 184 10*3/uL (ref 150.0–400.0)
RBC: 4.09 Mil/uL (ref 3.87–5.11)
RDW: 13 % (ref 11.5–15.5)
WBC: 3.8 10*3/uL — ABNORMAL LOW (ref 4.0–10.5)

## 2022-09-13 ENCOUNTER — Ambulatory Visit
Admission: RE | Admit: 2022-09-13 | Discharge: 2022-09-13 | Disposition: A | Discharge status: Home / Self Care | Payer: BC Managed Care – PPO | Source: Ambulatory Visit | Attending: Obstetrics and Gynecology | Admitting: Obstetrics and Gynecology

## 2022-09-13 DIAGNOSIS — Z1231 Encounter for screening mammogram for malignant neoplasm of breast: Secondary | ICD-10-CM

## 2022-09-27 DIAGNOSIS — N760 Acute vaginitis: Secondary | ICD-10-CM | POA: Diagnosis not present

## 2022-09-27 DIAGNOSIS — N952 Postmenopausal atrophic vaginitis: Secondary | ICD-10-CM | POA: Diagnosis not present

## 2022-10-03 DIAGNOSIS — Z1329 Encounter for screening for other suspected endocrine disorder: Secondary | ICD-10-CM | POA: Diagnosis not present

## 2022-10-03 DIAGNOSIS — E559 Vitamin D deficiency, unspecified: Secondary | ICD-10-CM | POA: Diagnosis not present

## 2022-10-03 DIAGNOSIS — F419 Anxiety disorder, unspecified: Secondary | ICD-10-CM | POA: Diagnosis not present

## 2022-10-03 DIAGNOSIS — E039 Hypothyroidism, unspecified: Secondary | ICD-10-CM | POA: Diagnosis not present

## 2022-10-03 DIAGNOSIS — N951 Menopausal and female climacteric states: Secondary | ICD-10-CM | POA: Diagnosis not present

## 2022-10-03 DIAGNOSIS — J302 Other seasonal allergic rhinitis: Secondary | ICD-10-CM | POA: Diagnosis not present

## 2022-10-03 DIAGNOSIS — R790 Abnormal level of blood mineral: Secondary | ICD-10-CM | POA: Diagnosis not present

## 2022-10-19 DIAGNOSIS — Z1329 Encounter for screening for other suspected endocrine disorder: Secondary | ICD-10-CM | POA: Diagnosis not present

## 2022-10-19 DIAGNOSIS — R790 Abnormal level of blood mineral: Secondary | ICD-10-CM | POA: Diagnosis not present

## 2022-10-19 DIAGNOSIS — J302 Other seasonal allergic rhinitis: Secondary | ICD-10-CM | POA: Diagnosis not present

## 2022-10-19 DIAGNOSIS — N951 Menopausal and female climacteric states: Secondary | ICD-10-CM | POA: Diagnosis not present

## 2022-12-22 DIAGNOSIS — Z681 Body mass index (BMI) 19 or less, adult: Secondary | ICD-10-CM | POA: Diagnosis not present

## 2022-12-22 DIAGNOSIS — Z01419 Encounter for gynecological examination (general) (routine) without abnormal findings: Secondary | ICD-10-CM | POA: Diagnosis not present

## 2023-01-09 DIAGNOSIS — F419 Anxiety disorder, unspecified: Secondary | ICD-10-CM | POA: Diagnosis not present

## 2023-01-09 DIAGNOSIS — R790 Abnormal level of blood mineral: Secondary | ICD-10-CM | POA: Diagnosis not present

## 2023-01-09 DIAGNOSIS — E039 Hypothyroidism, unspecified: Secondary | ICD-10-CM | POA: Diagnosis not present

## 2023-01-09 DIAGNOSIS — Z1329 Encounter for screening for other suspected endocrine disorder: Secondary | ICD-10-CM | POA: Diagnosis not present

## 2023-01-09 DIAGNOSIS — N951 Menopausal and female climacteric states: Secondary | ICD-10-CM | POA: Diagnosis not present

## 2023-01-09 DIAGNOSIS — J302 Other seasonal allergic rhinitis: Secondary | ICD-10-CM | POA: Diagnosis not present

## 2023-01-20 DIAGNOSIS — Z1329 Encounter for screening for other suspected endocrine disorder: Secondary | ICD-10-CM | POA: Diagnosis not present

## 2023-01-20 DIAGNOSIS — R790 Abnormal level of blood mineral: Secondary | ICD-10-CM | POA: Diagnosis not present

## 2023-01-20 DIAGNOSIS — J302 Other seasonal allergic rhinitis: Secondary | ICD-10-CM | POA: Diagnosis not present

## 2023-01-20 DIAGNOSIS — N951 Menopausal and female climacteric states: Secondary | ICD-10-CM | POA: Diagnosis not present

## 2023-02-02 DIAGNOSIS — H0102B Squamous blepharitis left eye, upper and lower eyelids: Secondary | ICD-10-CM | POA: Diagnosis not present

## 2023-02-02 DIAGNOSIS — H04123 Dry eye syndrome of bilateral lacrimal glands: Secondary | ICD-10-CM | POA: Diagnosis not present

## 2023-02-02 DIAGNOSIS — H0102A Squamous blepharitis right eye, upper and lower eyelids: Secondary | ICD-10-CM | POA: Diagnosis not present

## 2023-02-02 DIAGNOSIS — H1045 Other chronic allergic conjunctivitis: Secondary | ICD-10-CM | POA: Diagnosis not present

## 2023-08-10 ENCOUNTER — Other Ambulatory Visit: Payer: Self-pay | Admitting: Obstetrics and Gynecology

## 2023-08-10 DIAGNOSIS — Z1231 Encounter for screening mammogram for malignant neoplasm of breast: Secondary | ICD-10-CM

## 2023-09-14 ENCOUNTER — Ambulatory Visit
Admission: RE | Admit: 2023-09-14 | Discharge: 2023-09-14 | Disposition: A | Discharge status: Home / Self Care | Source: Ambulatory Visit | Attending: Obstetrics and Gynecology | Admitting: Obstetrics and Gynecology

## 2023-09-14 DIAGNOSIS — Z1231 Encounter for screening mammogram for malignant neoplasm of breast: Secondary | ICD-10-CM

## 2024-01-02 ENCOUNTER — Encounter: Payer: Self-pay | Admitting: Internal Medicine
